# Patient Record
Sex: Male | Born: 1965 | Race: Black or African American | Hispanic: No | Marital: Single | Smoking: Never smoker
Health system: Southern US, Community
[De-identification: ages and names within clinical notes are randomized; demographics above are authoritative.]

## PROBLEM LIST (undated history)

## (undated) DIAGNOSIS — N200 Calculus of kidney: Secondary | ICD-10-CM

## (undated) DIAGNOSIS — I10 Essential (primary) hypertension: Secondary | ICD-10-CM

## (undated) HISTORY — PX: HERNIA REPAIR: SHX51

---

## 1998-05-19 ENCOUNTER — Emergency Department (HOSPITAL_COMMUNITY): Admission: EM | Admit: 1998-05-19 | Discharge: 1998-05-19 | Payer: Self-pay | Admitting: Emergency Medicine

## 1998-05-20 ENCOUNTER — Emergency Department (HOSPITAL_COMMUNITY): Admission: EM | Admit: 1998-05-20 | Discharge: 1998-05-20 | Payer: Self-pay | Admitting: Internal Medicine

## 1998-07-29 ENCOUNTER — Emergency Department (HOSPITAL_COMMUNITY): Admission: EM | Admit: 1998-07-29 | Discharge: 1998-07-29 | Payer: Self-pay | Admitting: Emergency Medicine

## 1998-07-29 ENCOUNTER — Encounter: Payer: Self-pay | Admitting: Emergency Medicine

## 1998-10-12 ENCOUNTER — Emergency Department (HOSPITAL_COMMUNITY): Admission: EM | Admit: 1998-10-12 | Discharge: 1998-10-12 | Payer: Self-pay | Admitting: Internal Medicine

## 1998-11-20 ENCOUNTER — Emergency Department (HOSPITAL_COMMUNITY): Admission: EM | Admit: 1998-11-20 | Discharge: 1998-11-20 | Payer: Self-pay | Admitting: Emergency Medicine

## 1999-09-22 ENCOUNTER — Emergency Department (HOSPITAL_COMMUNITY): Admission: EM | Admit: 1999-09-22 | Discharge: 1999-09-22 | Payer: Self-pay | Admitting: Emergency Medicine

## 2000-11-05 ENCOUNTER — Encounter: Payer: Self-pay | Admitting: Emergency Medicine

## 2000-11-05 ENCOUNTER — Emergency Department (HOSPITAL_COMMUNITY): Admission: EM | Admit: 2000-11-05 | Discharge: 2000-11-05 | Payer: Self-pay | Admitting: Emergency Medicine

## 2001-05-30 ENCOUNTER — Ambulatory Visit (HOSPITAL_COMMUNITY): Admission: RE | Admit: 2001-05-30 | Discharge: 2001-05-30 | Payer: Self-pay | Admitting: General Surgery

## 2001-09-26 ENCOUNTER — Emergency Department (HOSPITAL_COMMUNITY): Admission: EM | Admit: 2001-09-26 | Discharge: 2001-09-26 | Payer: Self-pay | Admitting: Emergency Medicine

## 2001-12-10 ENCOUNTER — Emergency Department (HOSPITAL_COMMUNITY): Admission: EM | Admit: 2001-12-10 | Discharge: 2001-12-10 | Payer: Self-pay | Admitting: Emergency Medicine

## 2001-12-11 ENCOUNTER — Emergency Department (HOSPITAL_COMMUNITY): Admission: EM | Admit: 2001-12-11 | Discharge: 2001-12-11 | Payer: Self-pay

## 2002-04-13 ENCOUNTER — Encounter: Payer: Self-pay | Admitting: Urology

## 2002-04-13 ENCOUNTER — Ambulatory Visit (HOSPITAL_BASED_OUTPATIENT_CLINIC_OR_DEPARTMENT_OTHER): Admission: RE | Admit: 2002-04-13 | Discharge: 2002-04-13 | Payer: Self-pay | Admitting: Urology

## 2003-04-10 ENCOUNTER — Encounter: Payer: Self-pay | Admitting: Emergency Medicine

## 2003-04-10 ENCOUNTER — Emergency Department (HOSPITAL_COMMUNITY): Admission: EM | Admit: 2003-04-10 | Discharge: 2003-04-10 | Payer: Self-pay

## 2003-09-11 ENCOUNTER — Emergency Department (HOSPITAL_COMMUNITY): Admission: EM | Admit: 2003-09-11 | Discharge: 2003-09-11 | Payer: Self-pay | Admitting: Emergency Medicine

## 2005-02-19 ENCOUNTER — Emergency Department (HOSPITAL_COMMUNITY): Admission: EM | Admit: 2005-02-19 | Discharge: 2005-02-19 | Payer: Self-pay | Admitting: Emergency Medicine

## 2005-05-02 ENCOUNTER — Emergency Department (HOSPITAL_COMMUNITY): Admission: EM | Admit: 2005-05-02 | Discharge: 2005-05-02 | Payer: Self-pay | Admitting: Emergency Medicine

## 2006-06-22 ENCOUNTER — Emergency Department (HOSPITAL_COMMUNITY): Admission: EM | Admit: 2006-06-22 | Discharge: 2006-06-22 | Payer: Self-pay | Admitting: Family Medicine

## 2007-05-27 ENCOUNTER — Emergency Department (HOSPITAL_COMMUNITY): Admission: EM | Admit: 2007-05-27 | Discharge: 2007-05-27 | Payer: Self-pay | Admitting: Emergency Medicine

## 2007-05-28 ENCOUNTER — Emergency Department (HOSPITAL_COMMUNITY): Admission: EM | Admit: 2007-05-28 | Discharge: 2007-05-28 | Payer: Self-pay | Admitting: Podiatry

## 2007-09-27 ENCOUNTER — Ambulatory Visit: Payer: Self-pay | Admitting: Family Medicine

## 2007-09-27 DIAGNOSIS — I10 Essential (primary) hypertension: Secondary | ICD-10-CM | POA: Insufficient documentation

## 2007-09-28 LAB — CONVERTED CEMR LAB
BUN: 10 mg/dL (ref 6–23)
Cholesterol: 248 mg/dL (ref 0–200)
Creatinine, Ser: 1 mg/dL (ref 0.4–1.5)
Direct LDL: 173.4 mg/dL
GFR calc Af Amer: 107 mL/min
GFR calc non Af Amer: 88 mL/min
HDL: 41.1 mg/dL (ref >39.0)
Potassium: 4.2 meq/L (ref 3.5–5.1)
Sodium: 139 meq/L (ref 135–145)
Total CHOL/HDL Ratio: 6
Triglycerides: 172 mg/dL — ABNORMAL HIGH (ref 0–149)
VLDL: 34 mg/dL (ref 0–40)

## 2007-09-29 ENCOUNTER — Encounter (INDEPENDENT_AMBULATORY_CARE_PROVIDER_SITE_OTHER): Payer: Self-pay | Admitting: *Deleted

## 2007-12-22 ENCOUNTER — Encounter (INDEPENDENT_AMBULATORY_CARE_PROVIDER_SITE_OTHER): Payer: Self-pay | Admitting: *Deleted

## 2009-02-07 ENCOUNTER — Ambulatory Visit: Payer: Self-pay | Admitting: Internal Medicine

## 2009-02-11 LAB — CONVERTED CEMR LAB
ALT: 22 units/L (ref 0–53)
BUN: 15 mg/dL (ref 6–23)
Basophils Absolute: 0 10*3/uL (ref 0.0–0.1)
Cholesterol: 200 mg/dL (ref 0–200)
Creatinine, Ser: 1 mg/dL (ref 0.4–1.5)
GFR calc non Af Amer: 106.19 mL/min (ref >60)
Glucose, Bld: 84 mg/dL (ref 70–99)
HCT: 39.1 % (ref 39.0–52.0)
HDL: 32.9 mg/dL — ABNORMAL LOW (ref >39.00)
Lymphs Abs: 1.7 10*3/uL (ref 0.7–4.0)
Monocytes Absolute: 0.6 10*3/uL (ref 0.1–1.0)
Monocytes Relative: 10.1 % (ref 3.0–12.0)
Neutrophils Relative %: 59 % (ref 43.0–77.0)
Platelets: 254 10*3/uL (ref 150.0–400.0)
Potassium: 4.1 meq/L (ref 3.5–5.1)
RDW: 13.3 % (ref 11.5–14.6)
TSH: 1.06 microintl units/mL (ref 0.35–5.50)

## 2010-11-03 NOTE — Letter (Signed)
Summary: Staff Medical Report/[facility]  Staff Medical Report/[facility]   Imported By: Lanelle Bal 02/12/2009 11:50:20  _____________________________________________________________________  External Attachment:    Type:   Image     Comment:   External Document

## 2011-02-19 NOTE — Op Note (Signed)
[facility]  Patient:    Miguel Vaughn, Miguel Vaughn Visit Number: [id number] MRN: [id number]          Service Type: NES Location: NESC Attending Physician:  Trisha Mangle Dictated by:   Veverly Fells Vernie Ammons, M.D. Proc. Date: 04/13/02 Admit Date:  04/13/2002 Discharge Date: 04/13/2002                             Operative Report  PREOPERATIVE DIAGNOSIS:  Left ureteral calculus.  POSTOPERATIVE DIAGNOSIS:  Left ureteral calculus.  PROCEDURE:  Cystoscopy, left retrograde pyelogram with interpretation, left ureteroscopy and stone basketing with double J stent placement.  SURGEON:  Mark C. Vernie Ammons, M.D.  ANESTHESIA:  General lineae.  SPECIMEN:  Stone given to patient.  ESTIMATED BLOOD LOSS:  Approximately 1 cc.  DRAINS:  A 26 cm 4.5 [place] double J stent in the left ureter with string.  COMPLICATIONS:  None.  INDICATIONS FOR PROCEDURE:  The patient is a 45 year old black male whose had a left distal ureteral calculus that has failed to progress and it has caused intermittent pain. He is brought to the OR for ureteroscopic extraction and understands the risks, complications, alternatives and limitations to surgery.  DESCRIPTION OF PROCEDURE:  After informed consent, the patient was brought to the major OR, placed on the table, administered general anesthesia and then moved to the dorsal lithotomy position. His genitalia was sterilely prepped and draped. A 6 French rigid ureteroscope was then passed per urethra which was noted to be normal down to the sphincter which was intact. The prostatic urethra was unremarkable. The bladder had no tumor, stones or inflammatory lesions seen on minimal inspection with the ureteroscope. The left orifice was identified and entered with the ureteroscope, I was able to visualize the stone. I then performed retrograde pyelogram in standard fashion. I noted a proximal dilatation of the ureter but no other filling  defects. The stone was seen as a filling defect and the remainder of the collecting system was noted to be normal.  A nitinol basket was then passed through the ureteroscope and the stone was grasped and extracted.  I then inserted the cystoscope and passed the guidewire up the ureter under direct fluoroscopic control into the area of the renal pelvis. The double J stent was passed over the guidewire, the guidewire was removed with good curl being noted in the renal pelvis and bladder. The remainder of the bladder was then inspected with the cystoscope and noted to be normal. I drained the bladder and the patient was awakened and taken to the recovery room in stable satisfactory condition. He tolerated the procedure well with no intraoperative complications.  He will be given a prescription for Pyridium plus one q. 6h p.r.n. for bladder spasms #20 and Vicodin ES #28 p.r.n. for pain. He will follow-up in my office next week for his stent removal. Dictated by:   Veverly Fells. Vernie Ammons, M.D. Attending Physician:  Trisha Mangle DD:  04/13/02 TD:  04/16/02 Job: [id number]

## 2011-02-19 NOTE — Op Note (Signed)
[facility]  Patient:    Miguel Vaughn, Miguel Vaughn Visit Number: [id number] MRN: [id number]          Service Type: DSU Location: DAY Attending Physician:  Glenna Fellows Tappan Proc. Date: 05/30/01 Adm. Date:  05/30/2001                             Operative Report  PREOPERATIVE DIAGNOSIS:  Phimosis with redundant foreskin.  POSTOPERATIVE DIAGNOSIS:  Phimosis with redundant foreskin.  OPERATION PERFORMED:  Circumcision.  SURGEON:  Claudette Laws, M.D.  DESCRIPTION OF PROCEDURE:  This is a second operation for [name]. He had a right inguinal hernia repair by Dr. Glenna Fellows. Following the hernia repair, we prepped and draped the penis and circular incisions were outlined with the marking pen on the skin and mucosal surfaces. A knife was used to outline the incisions and then the excess foreskin was removed with Metzenbaum scissors leaving behind approximately 4-5 mm of mucosa attached to the corona. All bleeders were electrocoagulated. He was then injected with approximately 10 cc of 0.25% Marcaine mixed with 2% xylocaine for a penile block. We then reattached the shaft skin to the mucosa with interrupted sutures of 3-0 chromic catgut. A dry dressing was applied, circular gauze with Betadine and he was then taken back to the recovery room in satisfactory condition. Attending Physician:  Delsa Bern DD:  05/30/01 TD:  05/30/01 Job: [id number] [id number] [id number]

## 2011-02-19 NOTE — Op Note (Signed)
[facility]  Patient:    TESEAN, STUMP Visit Number: [id number] MRN: [id number]          Service Type: DSU Location: DAY Attending Physician:  Glenna Fellows Tappan Proc. Date: 05/30/01 Adm. Date:  05/30/2001                             Operative Report  PREOPERATIVE DIAGNOSIS:  Right inguinal hernia.  POSTOPERATIVE DIAGNOSIS:  Right inguinal hernia.  PROCEDURE:  Right inguinal hernia repair with mesh.  SURGEON:  Lorne Skeens. Hoxworth, M.D.  ASSISTANT:  Claudette Laws, M.D.  ANESTHESIA:  General.  BRIEF HISTORY:  Tayte Mcwherter is a 45 year old black male who presents with the acute onset of a painful and tender small reducible right inguinal hernia. Repair under local anesthesia with sedation using mesh has been recommended and accepted. The nature of the procedure, its indications, and risks of bleeding, infection and recurrence were discussed and understood preoperatively. He is now brought to the operating room for this procedure.  DESCRIPTION OF PROCEDURE:  The patient was brought to the operating room and placed in supine position on the operating table and general endotracheal anesthesia was induced. He was given Ancef intravenously. The right groin and genitalia were sterilely prepped and draped. An oblique incision was made in the right groin and dissection carried down to the subcutaneous tissue. Scarpas fascia was divided. The external oblique was identified and divided along the lines of its ligaments through the external ring. The cord was dissected off the floor of the pubic tubercle and cremasteric fibers were divided bilaterally up to the internal ring. The ilioinguinal nerve was identified and protected throughout the remainder of the dissection. The floor felt intact.  There was a moderate sized cord lipoma coming through a dilated internal ring. This was dissected away from the cord structures back to  and above the level of the internal ring where it was clamped, divided and tied with 3-0 Vicryl. There was no peritoneal sac and likely the preperitoneal herniation through the internal ring was the source of this hernia. Following this, a piece of Prolene mesh was trimmed to size to fit the floor with tails around the cord at the internal ring. The mesh was sutured to the pubic tubercle and then to the inguinal ligament with running 2-0 Prolene. Medially the mesh was sutured to the edge of the rectus sheath with interrupted 2-0 Prolene. The tails were then tacked together lateral to the cord with interrupted 2-0 Prolene creating a new internal ring snug to the finger tip. The operative site was inspected for hemostasis. The soft tissue was infiltrated with Marcaine. The external oblique was closed over the cords with running 3-0 Vicryl. Scarpas fascia was closed with running 3-0 Vicryl and the skin closed with running subcuticular 4-0 monocryl and Dermabond. At this point, Dr. Etta Grandchild proceeded with circumcision as planned. Attending Physician: Elta Guadeloupe DD:  05/30/01 TD:  05/30/01 Job: [id number]

## 2011-07-16 LAB — URINALYSIS, ROUTINE W REFLEX MICROSCOPIC
Glucose, UA: NEGATIVE
pH: 6.5

## 2011-07-16 LAB — DIFFERENTIAL
Basophils Absolute: 0
Basophils Relative: 0
Basophils Relative: 0
Eosinophils Absolute: 0.1
Eosinophils Relative: 2
Monocytes Absolute: 0.4
Monocytes Absolute: 0.6
Monocytes Relative: 10
Neutro Abs: 4
Neutrophils Relative %: 75
Neutrophils Relative %: 80 — ABNORMAL HIGH

## 2011-07-16 LAB — CBC
HCT: 37.7 — ABNORMAL LOW
Hemoglobin: 12.2 — ABNORMAL LOW
Hemoglobin: 12.7 — ABNORMAL LOW
RBC: 4.68
RDW: 12.9
WBC: 5

## 2011-07-16 LAB — COMPREHENSIVE METABOLIC PANEL
ALT: 87 — ABNORMAL HIGH
AST: 84 — ABNORMAL HIGH
Albumin: 3.5
Albumin: 3.7
Alkaline Phosphatase: 63
Alkaline Phosphatase: 64
BUN: 12
CO2: 25
Chloride: 97
Glucose, Bld: 102 — ABNORMAL HIGH
Glucose, Bld: 115 — ABNORMAL HIGH
Potassium: 4.1
Potassium: 4.1
Sodium: 133 — ABNORMAL LOW
Total Bilirubin: 0.6
Total Protein: 7.2

## 2011-11-29 ENCOUNTER — Emergency Department (HOSPITAL_COMMUNITY)
Admission: EM | Admit: 2011-11-29 | Discharge: 2011-11-29 | Disposition: A | Discharge status: Home / Self Care | Payer: 59 | Attending: Emergency Medicine | Admitting: Emergency Medicine

## 2011-11-29 ENCOUNTER — Encounter (HOSPITAL_COMMUNITY): Payer: Self-pay | Admitting: *Deleted

## 2011-11-29 ENCOUNTER — Emergency Department (HOSPITAL_COMMUNITY): Payer: 59

## 2011-11-29 ENCOUNTER — Other Ambulatory Visit: Payer: Self-pay

## 2011-11-29 DIAGNOSIS — I10 Essential (primary) hypertension: Secondary | ICD-10-CM | POA: Insufficient documentation

## 2011-11-29 DIAGNOSIS — R10819 Abdominal tenderness, unspecified site: Secondary | ICD-10-CM | POA: Insufficient documentation

## 2011-11-29 DIAGNOSIS — R1013 Epigastric pain: Secondary | ICD-10-CM

## 2011-11-29 HISTORY — DX: Essential (primary) hypertension: I10

## 2011-11-29 LAB — COMPREHENSIVE METABOLIC PANEL WITH GFR
Albumin: 3.9 g/dL (ref 3.5–5.2)
Alkaline Phosphatase: 72 U/L (ref 39–117)
BUN: 11 mg/dL (ref 6–23)
CO2: 29 meq/L (ref 19–32)
Chloride: 100 meq/L (ref 96–112)
GFR calc non Af Amer: 85 mL/min — ABNORMAL LOW (ref >90)
Potassium: 4.1 meq/L (ref 3.5–5.1)
Total Bilirubin: 0.3 mg/dL (ref 0.3–1.2)

## 2011-11-29 LAB — COMPREHENSIVE METABOLIC PANEL
ALT: 33 U/L (ref 0–53)
AST: 26 U/L (ref 0–37)
Calcium: 9.7 mg/dL (ref 8.4–10.5)
Creatinine, Ser: 1.05 mg/dL (ref 0.50–1.35)
GFR calc Af Amer: >90 mL/min (ref >90)
Glucose, Bld: 84 mg/dL (ref 70–99)
Sodium: 137 mEq/L (ref 135–145)
Total Protein: 7.5 g/dL (ref 6.0–8.3)

## 2011-11-29 LAB — DIFFERENTIAL
Basophils Absolute: 0 K/uL (ref 0.0–0.1)
Basophils Relative: 0 % (ref 0–1)
Eosinophils Absolute: 0.3 10*3/uL (ref 0.0–0.7)
Eosinophils Relative: 4 % (ref 0–5)
Lymphocytes Relative: 34 % (ref 12–46)
Lymphs Abs: 2.7 10*3/uL (ref 0.7–4.0)
Monocytes Absolute: 0.6 10*3/uL (ref 0.1–1.0)
Monocytes Relative: 7 % (ref 3–12)
Neutro Abs: 4.3 K/uL (ref 1.7–7.7)
Neutrophils Relative %: 55 % (ref 43–77)

## 2011-11-29 LAB — CBC
HCT: 40.6 % (ref 39.0–52.0)
Hemoglobin: 13.4 g/dL (ref 13.0–17.0)
MCH: 27.1 pg (ref 26.0–34.0)
MCHC: 33 g/dL (ref 30.0–36.0)
MCV: 82.2 fL (ref 78.0–100.0)
Platelets: 263 10*3/uL (ref 150–400)
RBC: 4.94 MIL/uL (ref 4.22–5.81)
RDW: 13.7 % (ref 11.5–15.5)
WBC: 7.9 K/uL (ref 4.0–10.5)

## 2011-11-29 LAB — LIPASE, BLOOD: Lipase: 22 U/L (ref 11–59)

## 2011-11-29 MED ORDER — PANTOPRAZOLE SODIUM 20 MG PO TBEC: 40.0000 mg | DELAYED_RELEASE_TABLET | Freq: Every day | ORAL | Status: DC

## 2011-11-29 MED ORDER — GI COCKTAIL ~~LOC~~
30.0000 mL | Freq: Once | ORAL | Status: AC
  Administered 2011-11-29: 30 mL via ORAL
  Filled 2011-11-29: qty 30

## 2011-11-29 MED ORDER — ONDANSETRON HCL 4 MG PO TABS: 4.0000 mg | ORAL_TABLET | Freq: Three times a day (TID) | ORAL | Status: AC | PRN

## 2011-11-29 MED ORDER — PANTOPRAZOLE SODIUM 40 MG PO TBEC: 40.0000 mg | DELAYED_RELEASE_TABLET | Freq: Every day | ORAL | Status: DC

## 2011-11-29 NOTE — Discharge Instructions (Signed)

## 2011-11-29 NOTE — ED Notes (Signed)
Pt reports upper abd pain that started x 2 days ago.  Pt denies any n/v/d at this time.  Pt also reports bila elbow pain-denies injury at this time.

## 2011-11-29 NOTE — ED Provider Notes (Signed)
History     CSN: [id number]  Arrival date & time 11/29/11  1737   First MD Initiated Contact with Patient 11/29/11 1817      Chief Complaint  Patient presents with  . Abdominal Pain    (Consider location/radiation/quality/duration/timing/severity/associated sxs/prior treatment) HPI Comments: Patient reports gradual onset and persistence of some upper abdominal discomfort which is slightly worse in the left upper side the right. He denies a skin rash. He denies any nausea or vomiting. He reports today it seemed worse about one hour after eating a sausage biscuit. He reports it has affected his appetite which is down. He denies any vomiting or diarrhea and no obvious sick contacts. He denies any new medications or foreign travel. He denies fever or chills or night sweats. He denies any unforeseen weight loss or weight gain. He denies radiation into his chest or into his lower abdomen or flanks. He denies dysuria. He reports no prior history of any abdominal surgeries. He denies any trauma. He reports that when he stretches his torso or coughs, the pain really seems to get worse. She also notes that his elbows have been more sore in the last several days. 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 He denies any skin rash, swelling, overt trauma. He reports no chest pain or shortness of breath or exertional symptoms. He reports certain movements of his elbows and upper extremity seem to exacerbate the discomfort. He has not taken any meds for either pain other than one dose of Pepto-Bismol about 3 days ago which did not change his pain. Since the pain seemed to be a little bit worse last night and today, the patient came here to the emergency department to be evaluated.  The history is provided by the patient.    Past Medical History  Diagnosis Date  . Hypertension     Past Surgical History  Procedure Date  . Hernia repair     History reviewed. No  pertinent family history.  History  Substance Use Topics  . Smoking status: Never Smoker   . Smokeless tobacco: Not on file  . Alcohol Use: No      Review of Systems  Constitutional: Positive for appetite change. Negative for fever, chills and activity change.  HENT: Negative for congestion and rhinorrhea.   Respiratory: Negative for cough and shortness of breath.   Cardiovascular: Negative for chest pain and leg swelling.  Gastrointestinal: Positive for abdominal pain.  Genitourinary: Negative for dysuria and flank pain.  Musculoskeletal: Negative for back pain.  All other systems reviewed and are negative.    Allergies  Shellfish allergy  Home Medications   Current Outpatient Rx  Name Route Sig Dispense Refill  . BISMUTH SUBSALICYLATE 262 MG/15ML PO SUSP Oral Take 15 mLs by mouth every 6 (six) hours as needed. For upset stomach    . PSEUDOEPH-DOXYLAMINE-DM-APAP 60-7.03-02-999 MG/30ML PO LIQD Oral Take 30 mLs by mouth every 6 (six) hours as needed. For col/flu symptom relief    . ONDANSETRON HCL 4 MG PO TABS Oral Take 1 tablet (4 mg total) by mouth every 8 (eight) hours as needed for nausea. 12 tablet 0  . PANTOPRAZOLE SODIUM 40 MG PO TBEC Oral Take 1 tablet (40 mg total) by mouth daily. 14 tablet 0    BP 121/98  Pulse 72  Temp(Src) 98.2 F (36.8 C) (Oral)  Resp 18  Wt 205 lb (92.987 kg)  SpO2 98%  Physical Exam  Nursing  note and vitals reviewed. Constitutional: He is oriented to person, place, and time. He appears well-developed and well-nourished.  HENT:  Head: Normocephalic.  Eyes: Pupils are equal, round, and reactive to light.  Cardiovascular: Normal rate.   Pulmonary/Chest: Effort normal and breath sounds normal. No respiratory distress. He has no wheezes.  Abdominal: Soft. Bowel sounds are normal. There is tenderness. There is no rebound and no guarding.  Musculoskeletal: He exhibits no edema and no tenderness.  Neurological: He is alert and oriented to  person, place, and time.  Skin: Skin is warm and dry. No rash noted. No erythema.  Psychiatric: He has a normal mood and affect.    ED Course  Procedures (including critical care time)  Labs Reviewed  COMPREHENSIVE METABOLIC PANEL - Abnormal; Notable for the following:    GFR calc non Af Amer 85 (*)    All other components within normal limits  CBC  DIFFERENTIAL  LIPASE, BLOOD   US Abdomen Complete  11/29/2011  *RADIOLOGY REPORT*  Clinical Data:  Abdominal pain  COMPLETE ABDOMINAL ULTRASOUND  Comparison:  None.  Findings:  Gallbladder:  No shadowing gallstones or echogenic sludge.  No gallbladder wall thickening or pericholecystic fluid.  Negative sonographic Murphy's sign according to the ultrasound technologist.  Common bile duct:  3.5 mm diameter, unremarkable  Liver:  No focal lesion identified.  Within normal limits in parenchymal echogenicity.  IVC:  Appears normal.  Pancreas:  Unremarkable, segments obscured by overlying bowel gas.  Spleen:  5.6 cm craniocaudal length, unremarkable  Right Kidney:  10 cm. No hydronephrosis.  Well-preserved cortex. Normal size and parenchymal echotexture without focal abnormalities.  Left Kidney:  10.2 cm. No hydronephrosis.  Well-preserved cortex. Normal size and parenchymal echotexture without focal abnormalities.  Abdominal aorta:  No aneurysm identified.  IMPRESSION:  1.  Negative.  Normal gallbladder.  Original Report Authenticated By: Thora Lance III, M.D.     1. Epigastric pain     ECG at time 18:15 shows NSR at rate 58, non specific T Wave abn's flipped in inf and lateral leads.  Lateral T Wave abn is worse compared to ECG from 05/27/07 which can be explained by repol abn related to HTN.    Given pt's abd tenderness on exam, I do not feel these changes reflect any acute cardiac ischemic changes.  No SOB, exertional worsening of symptoms, no diaphoresis or nausea is associated with his symptoms.    MDM  Will get U/S to r/o biliary  disease.  Will try GI cocktail otherwise to see if symptoms are relieved.  Stable vitals, no fever . No lower abd pain or tenderness, doesn't sound to be cardiac symptoms and cardiac exam is otherwise normal.        No sig rebound, I doubt surgical abd.  U/S which I reviewed myself, per radiologist shows normal GB, no acute abn's.  Labs are unremarkable.  I favor either musculoskeletal or GERD and indigestion, possibly early ulcer.  Pt counseled about musculoskeletal pain in elbows as well as follow up with PCP regarding elevated BP's.    9:11 PM Pt feels improved.  abd remains soft, no rebound, no guarding.  He feels comfortable going home, takign PPI, will give antiemetic just in case, knows to return for sudden worsening pain, fevers, persistent vomiting, blood in stool, CP, SOB.    Gavin Pound. Oletta Lamas, MD 11/29/11 2117

## 2011-12-20 ENCOUNTER — Encounter: Payer: 59 | Admitting: Internal Medicine

## 2012-01-13 ENCOUNTER — Emergency Department (HOSPITAL_COMMUNITY): Payer: 59

## 2012-01-13 ENCOUNTER — Emergency Department (HOSPITAL_COMMUNITY)
Admission: EM | Admit: 2012-01-13 | Discharge: 2012-01-13 | Disposition: A | Discharge status: Home / Self Care | Payer: 59 | Attending: Emergency Medicine | Admitting: Emergency Medicine

## 2012-01-13 ENCOUNTER — Encounter (HOSPITAL_COMMUNITY): Payer: Self-pay | Admitting: Radiology

## 2012-01-13 DIAGNOSIS — G44309 Post-traumatic headache, unspecified, not intractable: Secondary | ICD-10-CM | POA: Insufficient documentation

## 2012-01-13 DIAGNOSIS — I10 Essential (primary) hypertension: Secondary | ICD-10-CM | POA: Insufficient documentation

## 2012-01-13 MED ORDER — IBUPROFEN 600 MG PO TABS: 600.0000 mg | ORAL_TABLET | Freq: Four times a day (QID) | ORAL | Status: AC | PRN

## 2012-01-13 MED ORDER — TRAMADOL HCL 50 MG PO TABS: 50.0000 mg | ORAL_TABLET | Freq: Four times a day (QID) | ORAL | Status: AC | PRN

## 2012-01-13 MED ORDER — TRAMADOL HCL 50 MG PO TABS
50.0000 mg | ORAL_TABLET | Freq: Once | ORAL | Status: AC
  Administered 2012-01-13: 50 mg via ORAL
  Filled 2012-01-13: qty 1

## 2012-01-13 NOTE — Discharge Instructions (Signed)
Post-Concussion Syndrome Post-concussion syndrome describes the symptoms that can occur after a head injury. These symptoms can last from weeks to months. CAUSES  It is not clear why some head injuries cause post-concussion syndrome. It can occur whether your head injury was mild or severe and whether you were wearing head protection or not.  SYMPTOMS  Memory difficulties.   Dizziness.   Headaches.   Double vision or blurry vision.   Sensitivity to light.   Hearing difficulties.   Depression.   Tiredness.   Weakness.   Difficulty with concentration.   Difficulty sleeping or staying asleep.   Vomiting.  DIAGNOSIS  There is no test to determine whether you have post-concussion syndrome. Your caregiver may order an imaging scan of your brain, such as a CT scan, to check for other problems that may be causing your symptoms (such as severe injury inside your skull). TREATMENT  Usually, these problems disappear over time without medical care. Your caregiver may prescribe medicine to help ease your symptoms. It is important to follow up with a neurologist to evaluate your recovery and address any lingering symptoms or issues. HOME CARE INSTRUCTIONS   Only take over-the-counter or prescription medicines for pain, discomfort, or fever as directed by your caregiver. Do not take aspirin. Aspirin can slow blood clotting.   Sleep with your head slightly elevated to help with headaches.   Avoid any situation where there is potential for another head injury (football, hockey, martial arts, horseback riding). Your condition will get worse every time you experience a concussion. You should avoid these activities until you are evaluated by the appropriate follow-up caregivers.   Keep all follow-up appointments as directed by your caregiver.  SEEK IMMEDIATE MEDICAL CARE IF:  You develop confusion or unusual drowsiness.   You cannot wake the injured person.   You develop nausea or  persistent, forceful vomiting.   You feel like you are moving when you are not (vertigo).   You notice the injured person's eyes moving rapidly back and forth. This may be a sign of vertigo.   You have convulsions or faint.   You have severe, persistent headaches that are not relieved by medicine.   You cannot use your arms or legs normally.   Your pupils change size.   You have clear or bloody discharge from the nose or ears.   Your problems are getting worse, not better.  MAKE SURE YOU:  Understand these instructions.   Will watch your condition.   Will get help right away if you are not doing well or get worse.  Document Released: 03/12/2002 Document Revised: 09/09/2011 Document Reviewed: 04/08/2011 [facility] Patient Information 2012 [place], [place].
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
## 2012-01-13 NOTE — ED Notes (Signed)
Reviewed discharge instructions and Rx for Motrin and Ultram
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
## 2012-01-18 ENCOUNTER — Encounter: Payer: Self-pay | Admitting: Internal Medicine

## 2012-01-18 ENCOUNTER — Ambulatory Visit (INDEPENDENT_AMBULATORY_CARE_PROVIDER_SITE_OTHER): Payer: 59 | Admitting: Internal Medicine

## 2012-01-18 VITALS — BP 134/86 | HR 77 | Temp 98.2°F | Wt 210.0 lb

## 2012-01-18 DIAGNOSIS — S060X9A Concussion with loss of consciousness of unspecified duration, initial encounter: Secondary | ICD-10-CM
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
## 2012-01-28 ENCOUNTER — Encounter: Payer: 59 | Admitting: Internal Medicine

## 2012-02-25 ENCOUNTER — Encounter (HOSPITAL_COMMUNITY): Payer: Self-pay | Admitting: *Deleted

## 2012-02-25 ENCOUNTER — Emergency Department (HOSPITAL_COMMUNITY): Payer: Worker's Compensation

## 2012-02-25 ENCOUNTER — Emergency Department (HOSPITAL_COMMUNITY)
Admission: EM | Admit: 2012-02-25 | Discharge: 2012-02-25 | Disposition: A | Discharge status: Home / Self Care | Payer: Worker's Compensation | Attending: Emergency Medicine | Admitting: Emergency Medicine

## 2012-02-25 DIAGNOSIS — Y998 Other external cause status: Secondary | ICD-10-CM | POA: Insufficient documentation

## 2012-02-25 DIAGNOSIS — S02640A Fracture of ramus of mandible, unspecified side, initial encounter for closed fracture: Secondary | ICD-10-CM | POA: Insufficient documentation

## 2012-02-25 DIAGNOSIS — Y9389 Activity, other specified: Secondary | ICD-10-CM | POA: Insufficient documentation

## 2012-02-25 DIAGNOSIS — S02609A Fracture of mandible, unspecified, initial encounter for closed fracture: Secondary | ICD-10-CM

## 2012-02-25 DIAGNOSIS — W1809XA Striking against other object with subsequent fall, initial encounter: Secondary | ICD-10-CM | POA: Insufficient documentation

## 2012-02-25 DIAGNOSIS — I10 Essential (primary) hypertension: Secondary | ICD-10-CM | POA: Insufficient documentation

## 2012-02-25 MED ORDER — HYDROCODONE-ACETAMINOPHEN 7.5-500 MG/15ML PO SOLN: 15.0000 mL | Freq: Four times a day (QID) | ORAL | Status: DC | PRN

## 2012-02-25 MED ORDER — CLINDAMYCIN HCL 300 MG PO CAPS
300.0000 mg | ORAL_CAPSULE | Freq: Once | ORAL | Status: AC
  Administered 2012-02-25: 300 mg via ORAL
  Filled 2012-02-25: qty 1

## 2012-02-25 MED ORDER — OXYCODONE-ACETAMINOPHEN 5-325 MG PO TABS
1.0000 | ORAL_TABLET | Freq: Once | ORAL | Status: AC
  Administered 2012-02-25: 1 via ORAL
  Filled 2012-02-25: qty 1

## 2012-02-25 MED ORDER — CLINDAMYCIN HCL 150 MG PO CAPS: ORAL_CAPSULE | ORAL | Status: DC

## 2012-02-25 NOTE — ED Provider Notes (Signed)
Fall with isolated injury with isolated pain and tenderness to the ankle of the right mandible with jaw malocclusion for the last 2 days with dentition intact and no neck pain or cervical spine tenderness with x-rays revealing mildly displaced fracture of the right mandibular ramus, ENT paged on-call for mandible for f/u. ENT will take Pt to OR next day.  Medical screening examination/treatment/procedure(s) were conducted as a shared visit with non-physician practitioner(s) and myself.  I personally evaluated the patient during the encounter  Hurman Horn, MD 02/28/12 2307

## 2012-02-25 NOTE — ED Provider Notes (Signed)
History     CSN: [id number]  Arrival date & time 02/25/12  1756   First MD Initiated Contact with Patient 02/25/12 1838      Chief Complaint  Patient presents with  . Facial Injury    (Consider location/radiation/quality/duration/timing/severity/associated sxs/prior treatment) HPI  46 year old male presents complaining of jaw pain. Patient states several days ago he was getting into his truck when he slipped, hitting his right side of face against concrete. He noticed immediate pain to his jaw. He is also having trouble chewing due to jaw pain.  Patient noticed that his teeth are not aligning appropriately.  Sts he can't open mouth and haven't been able to eat since.  He also experiencing headache due to his jaw pain.  Pain radiates to R ear.  Denies LOC, neck pain, bleeding in mouth, loose tooth, numbness or rash.  Denies throat swelling.    Past Medical History  Diagnosis Date  . Hypertension     Past Surgical History  Procedure Date  . Hernia repair     No family history on file.  History  Substance Use Topics  . Smoking status: Never Smoker   . Smokeless tobacco: Not on file  . Alcohol Use: No      Review of Systems  All other systems reviewed and are negative.    Allergies  Shellfish allergy  Home Medications   Current Outpatient Rx  Name Route Sig Dispense Refill  . ACETAMINOPHEN 325 MG PO TABS Oral Take 650 mg by mouth every 6 (six) hours as needed. For pain      BP 133/96  Pulse 66  Temp(Src) 98.4 F (36.9 C) (Oral)  Resp 18  Wt 200 lb (90.719 kg)  SpO2 99%  Physical Exam  Nursing note and vitals reviewed. Constitutional: He appears well-developed and well-nourished. No distress.  HENT:  Head: Normocephalic.  Right Ear: External ear normal.  Left Ear: External ear normal.  Nose: Nose normal.  Mouth/Throat: No oropharyngeal exudate.       Edema noted to right mandibular region with point tenderness to the right rami of jaw. Trismus noted.  No loose teeth on exam. No laceration to oral mucosa.  Eyes: Conjunctivae are normal.  Neck: Normal range of motion. Neck supple.  Musculoskeletal: Normal range of motion.       Cervical back: Normal.  Neurological: He is alert.  Skin: Skin is warm. No rash noted.    ED Course  Procedures (including critical care time)  Labs Reviewed - No data to display No results found.   No diagnosis found.  Dg Orthopantogram  02/25/2012  *RADIOLOGY REPORT*  Clinical Data: Fall, right facial injury, right jaw pain  ORTHOPANTOGRAM/PANORAMIC  Comparison: None.  Findings: Mildly displaced fracture through the right mandibular ramus.  IMPRESSION: Mildly displaced fracture through the right mandibular ramus.  Original Report Authenticated By: Charline Bills, M.D.      MDM  Patient has a mechanical fall and injure his right jaw at the rami. X-ray shows a mildly displaced fracture through the right mandibular ramus. This is a closed fracture.  No other injury. I discussed result with patient and with my attending who has seen and evaluate patient. Patient will follow up with the specialist for further management. Care instruction given.  8:33 PM Dr. Pollyann Kennedy has been consulted by my attending.  Pt is scheduled to have surgery ORIF tomorrow.  WIll give clindamycin and pain medication here.  Recommend NPO after midnight.  Pt to come to  [facility] for surgery tomorrow in the AM.  Pt voice understanding and agrees with plan.        Fayrene Helper, PA-C 02/25/12 2103

## 2012-02-25 NOTE — ED Notes (Signed)
Given apple sauce per pt request.

## 2012-02-25 NOTE — ED Notes (Signed)
Pt states "I was getting in my truck & slipped hitting my face, can't open my mouth and haven't eaten since it happened"; pt presents with right facial edema

## 2012-02-25 NOTE — Discharge Instructions (Addendum)
Come to [facility] Short Stay area - be there at 6:00 am for 7:30 surgery.  Take Clindamycin 300mg  once tonight before midnight.  Take pain medication as needed before midnight.  Nothing to eat or drink after midnight tonight.  Plan to stay the night Saturday and go home Sunday morning.  Mandibular Fracture You have a fracture of your mandible. This fracture is a break in the jaw bone. This is usually caused by trauma (a blow to the jaw). HOME CARE INSTRUCTIONS   If your jaws are wired, learn how to release them quickly in case of vomiting or severe coughing. Keep the wire cutters in an easy to reach location or carry them with you.   Apply ice to the injury for 15 to 20 minutes each hour while awake for the first 2 days. Put the ice in a plastic bag and place a towel between the bag of ice and your skin.   Eat a well balanced high-protein (full) liquid diet while your jaw is wired. Your caregiver or dietician can help you with this.   Protect your jaw from pressure. Sleep on your back.   Avoid exercising to the point that you become short of breath.   Only take over-the-counter or prescription medicines for pain, discomfort, or fever as directed by your caregiver.  SEEK MEDICAL CARE IF:   You develop a severe headache or numbness in your face.   You have severe jaw pain unrelieved with medications.   The wires or splints become loose.   You have uncontrollable nausea (feeling sick to your stomach) or anxiety.  SEEK IMMEDIATE MEDICAL CARE IF:  You have a fever.   You have difficulty breathing.   You feel like your airway is closing or hear a high-pitched sound when you try and breathe.   Problems have forced you to cut the wires holding your upper and lower teeth together.  MAKE SURE YOU:   Understand these instructions.   Will watch your condition.   Will get help right away if you are not doing well or get worse.  Document Released: 09/20/2005 Document  Revised: 09/09/2011 Document Reviewed: 05/10/2008 [facility] Patient Information 2012 [place], [place].

## 2012-02-26 ENCOUNTER — Ambulatory Visit (HOSPITAL_COMMUNITY): Payer: Worker's Compensation | Admitting: Anesthesiology

## 2012-02-26 ENCOUNTER — Encounter (HOSPITAL_COMMUNITY): Payer: Self-pay | Admitting: *Deleted

## 2012-02-26 ENCOUNTER — Encounter (HOSPITAL_COMMUNITY)
Disposition: A | Discharge status: Home / Self Care | Payer: Self-pay | Source: Ambulatory Visit | Attending: Otolaryngology

## 2012-02-26 ENCOUNTER — Encounter (HOSPITAL_COMMUNITY): Payer: Self-pay | Admitting: Anesthesiology

## 2012-02-26 ENCOUNTER — Ambulatory Visit (HOSPITAL_COMMUNITY)
Admit: 2012-02-26 | Discharge: 2012-02-27 | Disposition: A | Discharge status: Home / Self Care | Payer: Worker's Compensation | Source: Ambulatory Visit | Attending: Otolaryngology | Admitting: Otolaryngology

## 2012-02-26 DIAGNOSIS — S02640A Fracture of ramus of mandible, unspecified side, initial encounter for closed fracture: Secondary | ICD-10-CM | POA: Insufficient documentation

## 2012-02-26 DIAGNOSIS — W19XXXA Unspecified fall, initial encounter: Secondary | ICD-10-CM | POA: Insufficient documentation

## 2012-02-26 DIAGNOSIS — S02609A Fracture of mandible, unspecified, initial encounter for closed fracture: Secondary | ICD-10-CM

## 2012-02-26 DIAGNOSIS — I10 Essential (primary) hypertension: Secondary | ICD-10-CM | POA: Insufficient documentation

## 2012-02-26 HISTORY — PX: ORIF MANDIBULAR FRACTURE: SHX2127

## 2012-02-26 LAB — SURGICAL PCR SCREEN: MRSA, PCR: NEGATIVE

## 2012-02-26 LAB — CBC
Hemoglobin: 12.9 g/dL — ABNORMAL LOW (ref 13.0–17.0)
MCH: 26.7 pg (ref 26.0–34.0)
RBC: 4.83 MIL/uL (ref 4.22–5.81)
WBC: 7.7 10*3/uL (ref 4.0–10.5)

## 2012-02-26 SURGERY — OPEN REDUCTION INTERNAL FIXATION (ORIF) MANDIBULAR FRACTURE
Anesthesia: General | Site: Mouth | Wound class: Clean Contaminated

## 2012-02-26 MED ORDER — PROPOFOL 10 MG/ML IV EMUL
INTRAVENOUS | Status: DC | PRN
  Administered 2012-02-26: 200 mg via INTRAVENOUS

## 2012-02-26 MED ORDER — MIDAZOLAM HCL 5 MG/5ML IJ SOLN
INTRAMUSCULAR | Status: DC | PRN
  Administered 2012-02-26: 2 mg via INTRAVENOUS

## 2012-02-26 MED ORDER — SUFENTANIL CITRATE 50 MCG/ML IV SOLN
INTRAVENOUS | Status: DC | PRN
  Administered 2012-02-26: 20 ug via INTRAVENOUS
  Administered 2012-02-26: 10 ug via INTRAVENOUS

## 2012-02-26 MED ORDER — HYDROCODONE-ACETAMINOPHEN 7.5-500 MG/15ML PO SOLN
15.0000 mL | Freq: Four times a day (QID) | ORAL | Status: DC | PRN
  Administered 2012-02-26: 15 mL via ORAL
  Filled 2012-02-26: qty 15

## 2012-02-26 MED ORDER — MUPIROCIN 2 % EX OINT
TOPICAL_OINTMENT | CUTANEOUS | Status: AC
  Administered 2012-02-26: 1 via NASAL
  Filled 2012-02-26: qty 22

## 2012-02-26 MED ORDER — HYDROMORPHONE HCL PF 1 MG/ML IJ SOLN
INTRAMUSCULAR | Status: AC
  Filled 2012-02-26: qty 1

## 2012-02-26 MED ORDER — ACETAMINOPHEN 10 MG/ML IV SOLN
1000.0000 mg | Freq: Once | INTRAVENOUS | Status: DC
  Administered 2012-02-26: 1000 mg via INTRAVENOUS

## 2012-02-26 MED ORDER — PROMETHAZINE HCL 25 MG RE SUPP
25.0000 mg | Freq: Four times a day (QID) | RECTAL | Status: DC | PRN
  Filled 2012-02-26: qty 1

## 2012-02-26 MED ORDER — ACETAMINOPHEN 10 MG/ML IV SOLN
1000.0000 mg | Freq: Once | INTRAVENOUS | Status: AC
  Filled 2012-02-26: qty 100

## 2012-02-26 MED ORDER — LABETALOL HCL 5 MG/ML IV SOLN
INTRAVENOUS | Status: DC | PRN
  Administered 2012-02-26: 10 mg via INTRAVENOUS

## 2012-02-26 MED ORDER — ACETAMINOPHEN 10 MG/ML IV SOLN
INTRAVENOUS | Status: AC
  Administered 2012-02-26: 1000 mg
  Filled 2012-02-26: qty 100

## 2012-02-26 MED ORDER — ONDANSETRON HCL 4 MG/2ML IJ SOLN
4.0000 mg | Freq: Three times a day (TID) | INTRAMUSCULAR | Status: DC | PRN
  Administered 2012-02-26: 4 mg via INTRAVENOUS
  Filled 2012-02-26: qty 2

## 2012-02-26 MED ORDER — DEXTROSE-NACL 5-0.9 % IV SOLN
INTRAVENOUS | Status: DC
  Administered 2012-02-26 – 2012-02-27 (×2): via INTRAVENOUS

## 2012-02-26 MED ORDER — CLINDAMYCIN HCL 300 MG PO CAPS: 300.0000 mg | ORAL_CAPSULE | Freq: Three times a day (TID) | ORAL | Status: AC

## 2012-02-26 MED ORDER — TRAMADOL HCL 50 MG PO TABS
50.0000 mg | ORAL_TABLET | Freq: Four times a day (QID) | ORAL | Status: DC
  Administered 2012-02-26 – 2012-02-27 (×3): 50 mg via ORAL
  Filled 2012-02-26 (×7): qty 1

## 2012-02-26 MED ORDER — SUCCINYLCHOLINE CHLORIDE 20 MG/ML IJ SOLN
INTRAMUSCULAR | Status: DC | PRN
  Administered 2012-02-26: 120 mg via INTRAVENOUS

## 2012-02-26 MED ORDER — ONDANSETRON HCL 4 MG/2ML IJ SOLN: 4.0000 mg | Freq: Once | INTRAMUSCULAR | Status: DC | PRN

## 2012-02-26 MED ORDER — LACTATED RINGERS IV SOLN
INTRAVENOUS | Status: DC | PRN
  Administered 2012-02-26: 08:00:00 via INTRAVENOUS

## 2012-02-26 MED ORDER — CLINDAMYCIN HCL 300 MG PO CAPS
300.0000 mg | ORAL_CAPSULE | Freq: Three times a day (TID) | ORAL | Status: DC
  Administered 2012-02-26 – 2012-02-27 (×3): 300 mg via ORAL
  Filled 2012-02-26 (×6): qty 1

## 2012-02-26 MED ORDER — HYDROMORPHONE HCL PF 1 MG/ML IJ SOLN
0.2500 mg | INTRAMUSCULAR | Status: DC | PRN
  Administered 2012-02-26 (×3): 0.5 mg via INTRAVENOUS

## 2012-02-26 MED ORDER — OXYMETAZOLINE HCL 0.05 % NA SOLN
2.0000 | NASAL | Status: DC
  Filled 2012-02-26: qty 15

## 2012-02-26 MED ORDER — DEXTROSE 5 % IV SOLN
INTRAVENOUS | Status: AC
  Filled 2012-02-26: qty 50

## 2012-02-26 MED ORDER — PROMETHAZINE HCL 25 MG PO TABS
25.0000 mg | ORAL_TABLET | Freq: Four times a day (QID) | ORAL | Status: DC | PRN
  Administered 2012-02-26: 25 mg via ORAL
  Filled 2012-02-26: qty 1

## 2012-02-26 MED ORDER — WHITE PETROLATUM GEL
Status: AC
  Administered 2012-02-26: 14:00:00
  Filled 2012-02-26: qty 5

## 2012-02-26 MED ORDER — ROCURONIUM BROMIDE 100 MG/10ML IV SOLN
INTRAVENOUS | Status: DC | PRN
  Administered 2012-02-26: 200 mg via INTRAVENOUS
  Administered 2012-02-26: 20 mg via INTRAVENOUS

## 2012-02-26 MED ORDER — IBUPROFEN 100 MG/5ML PO SUSP
400.0000 mg | Freq: Four times a day (QID) | ORAL | Status: DC | PRN
  Administered 2012-02-26 (×2): 400 mg via ORAL
  Filled 2012-02-26 (×3): qty 20

## 2012-02-26 MED ORDER — ONDANSETRON HCL 4 MG/2ML IJ SOLN
INTRAMUSCULAR | Status: DC | PRN
  Administered 2012-02-26: 4 mg via INTRAVENOUS

## 2012-02-26 MED ORDER — TRAMADOL 5 MG/ML ORAL SUSPENSION: 50.0000 mg | Freq: Four times a day (QID) | ORAL | Status: DC

## 2012-02-26 MED ORDER — CLINDAMYCIN PHOSPHATE 600 MG/50ML IV SOLN
600.0000 mg | INTRAVENOUS | Status: AC
  Administered 2012-02-26: 600 mg via INTRAVENOUS
  Filled 2012-02-26: qty 50

## 2012-02-26 MED ORDER — OXYMETAZOLINE HCL 0.05 % NA SOLN
NASAL | Status: DC | PRN
  Administered 2012-02-26: 1 via NASAL

## 2012-02-26 MED ORDER — NEOSTIGMINE METHYLSULFATE 1 MG/ML IJ SOLN
INTRAMUSCULAR | Status: DC | PRN
  Administered 2012-02-26: 5 mg via INTRAVENOUS

## 2012-02-26 MED ORDER — ACETAMINOPHEN 10 MG/ML IV SOLN: 1000.0000 mg | Freq: Four times a day (QID) | INTRAVENOUS | Status: DC

## 2012-02-26 MED ORDER — PROMETHAZINE HCL 25 MG RE SUPP: 25.0000 mg | Freq: Four times a day (QID) | RECTAL | Status: AC | PRN

## 2012-02-26 MED ORDER — CLINDAMYCIN PHOSPHATE 600 MG/4ML IJ SOLN
INTRAMUSCULAR | Status: AC
  Filled 2012-02-26: qty 4

## 2012-02-26 MED ORDER — 0.9 % SODIUM CHLORIDE (POUR BTL) OPTIME
TOPICAL | Status: DC | PRN
  Administered 2012-02-26: 1000 mL

## 2012-02-26 MED ORDER — GLYCOPYRROLATE 0.2 MG/ML IJ SOLN
INTRAMUSCULAR | Status: DC | PRN
  Administered 2012-02-26: 0.6 mg via INTRAVENOUS

## 2012-02-26 SURGICAL SUPPLY — 36 items
CANISTER SUCTION 2500CC (MISCELLANEOUS) ×2 IMPLANT
CLEANER TIP ELECTROSURG 2X2 (MISCELLANEOUS) IMPLANT
CLOTH BEACON ORANGE TIMEOUT ST (SAFETY) ×2 IMPLANT
COVER SURGICAL LIGHT HANDLE (MISCELLANEOUS) ×2 IMPLANT
DECANTER SPIKE VIAL GLASS SM (MISCELLANEOUS) ×2 IMPLANT
ELECT COATED BLADE 2.86 ST (ELECTRODE) ×2 IMPLANT
ELECT NEEDLE TIP 2.8 STRL (NEEDLE) IMPLANT
ELECT REM PT RETURN 9FT ADLT (ELECTROSURGICAL) ×2
ELECTRODE REM PT RTRN 9FT ADLT (ELECTROSURGICAL) ×1 IMPLANT
ERX 10427 IMPLANT
GLOVE BIO SURGEON STRL SZ8 (GLOVE) ×2 IMPLANT
GLOVE BIOGEL PI IND STRL 8 (GLOVE) ×1 IMPLANT
GLOVE BIOGEL PI INDICATOR 8 (GLOVE) ×1
GLOVE ECLIPSE 7.5 STRL STRAW (GLOVE) ×2 IMPLANT
GOWN STRL NON-REIN LRG LVL3 (GOWN DISPOSABLE) ×2 IMPLANT
GOWN STRL REIN 2XL LVL4 (GOWN DISPOSABLE) ×2 IMPLANT
KIT BASIN OR (CUSTOM PROCEDURE TRAY) ×2 IMPLANT
KIT ROOM TURNOVER OR (KITS) ×2 IMPLANT
NEEDLE 27GAX1X1/2 (NEEDLE) ×2 IMPLANT
NEEDLE HYPO 25GX1X1/2 BEV (NEEDLE) IMPLANT
NS IRRIG 1000ML POUR BTL (IV SOLUTION) ×2 IMPLANT
PAD ARMBOARD 7.5X6 YLW CONV (MISCELLANEOUS) ×2 IMPLANT
PENCIL FOOT CONTROL (ELECTRODE) ×2 IMPLANT
SCISSORS WIRE DISP (INSTRUMENTS) ×2 IMPLANT
SCREW UPPER FACE 2.0X12MM (Screw) ×4 IMPLANT
SCREW UPPER FACE 2.0X8MM (Screw) ×4 IMPLANT
SUT CHROMIC 3 0 PS 2 (SUTURE) IMPLANT
SUT STEEL 0 (SUTURE)
SUT STEEL 0 18XMFL TIE 17 (SUTURE) IMPLANT
SUT STEEL 2 (SUTURE) IMPLANT
SUT STEEL 4 (SUTURE) IMPLANT
SUT VICRYL 4-0 PS2 18IN ABS (SUTURE) IMPLANT
TOWEL OR 17X24 6PK STRL BLUE (TOWEL DISPOSABLE) ×2 IMPLANT
TOWEL OR 17X26 10 PK STRL BLUE (TOWEL DISPOSABLE) ×2 IMPLANT
TRAY ENT MC OR (CUSTOM PROCEDURE TRAY) ×2 IMPLANT
WATER STERILE IRR 1000ML POUR (IV SOLUTION) ×2 IMPLANT
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
## 2012-02-26 NOTE — Progress Notes (Signed)
Patient ID: Miguel Vaughn, male   DOB: 1966/03/27, 46 y.o.   MRN: [id number] Having nausea with the hydrocodone. MMF is stable. Will adjust pain medicine. Anticipate D/C in am.

## 2012-02-26 NOTE — Anesthesia Postprocedure Evaluation (Signed)
  Anesthesia Post-op Note  Patient: Miguel Vaughn  Procedure(s) Performed: Procedure(s) (LRB): OPEN REDUCTION INTERNAL FIXATION (ORIF) MANDIBULAR FRACTURE (N/A)  Patient Location: PACU  Anesthesia Type: General  Level of Consciousness: awake, alert  and oriented  Airway and Oxygen Therapy: Patient Spontanous Breathing and Patient connected to nasal cannula oxygen  Post-op Pain: moderate  Post-op Assessment: Post-op Vital signs reviewed  Post-op Vital Signs: Reviewed  Complications: No apparent anesthesia complications

## 2012-02-26 NOTE — Anesthesia Preprocedure Evaluation (Addendum)
Anesthesia Evaluation  Patient identified by MRN, date of birth, ID band Patient awake    History of Anesthesia Complications Negative for: history of anesthetic complications  Airway Mallampati: III TM Distance: >3 FB Neck ROM: Full  Mouth opening: Limited Mouth Opening  Dental No notable dental hx. (+) Teeth Intact and Dental Advisory Given   Pulmonary neg pulmonary ROS,  breath sounds clear to auscultation  Pulmonary exam normal       Cardiovascular Exercise Tolerance: Good hypertension, Rhythm:Regular Rate:Normal     Neuro/Psych negative neurological ROS  negative psych ROS   GI/Hepatic negative GI ROS, Neg liver ROS,   Endo/Other  negative endocrine ROS  Renal/GU negative Renal ROS     Musculoskeletal negative musculoskeletal ROS (+)   Abdominal   Peds  Hematology negative hematology ROS (+)   Anesthesia Other Findings   Reproductive/Obstetrics                          Anesthesia Physical Anesthesia Plan  ASA: II  Anesthesia Plan: General   Post-op Pain Management:    Induction: Intravenous  Airway Management Planned: Nasal ETT  Additional Equipment:   Intra-op Plan:   Post-operative Plan: Extubation in OR  Informed Consent: I have reviewed the patients History and Physical, chart, labs and discussed the procedure including the risks, benefits and alternatives for the proposed anesthesia with the patient or authorized representative who has indicated his/her understanding and acceptance.   Dental advisory given  Plan Discussed with: CRNA, Anesthesiologist and Surgeon  Anesthesia Plan Comments:        Anesthesia Quick Evaluation

## 2012-02-26 NOTE — Transfer of Care (Signed)
Immediate Anesthesia Transfer of Care Note  Patient: Miguel Vaughn  Procedure(s) Performed: Procedure(s) (LRB): OPEN REDUCTION INTERNAL FIXATION (ORIF) MANDIBULAR FRACTURE (N/A)  Patient Location: PACU  Anesthesia Type: General  Level of Consciousness: awake and sedated  Airway & Oxygen Therapy: Patient Spontanous Breathing  Post-op Assessment: Report given to PACU RN, Post -op Vital signs reviewed and stable and Patient moving all extremities  Post vital signs: Reviewed and stable  Complications: No apparent anesthesia complications

## 2012-02-26 NOTE — Discharge Instructions (Signed)
Mandibular Fracture   You have a fracture of your mandible. This fracture is a break in the jaw bone. This is usually caused by trauma (a blow to the jaw).   HOME CARE INSTRUCTIONS   If your jaws are wired, learn how to release them quickly in case of vomiting or severe coughing. Keep the wire cutters in an easy to reach location or carry them with you.   Apply ice to the injury for 15 to 20 minutes each hour while awake for the first 2 days. Put the ice in a plastic bag and place a towel between the bag of ice and your skin.   Eat a well balanced high-protein (full) liquid diet while your jaw is wired. Your caregiver or dietician can help you with this.   Protect your jaw from pressure. Sleep on your back.   Avoid exercising to the point that you become short of breath.   Only take over-the-counter or prescription medicines for pain, discomfort, or fever as directed by your caregiver.   SEEK MEDICAL CARE IF:   You develop a severe headache or numbness in your face.   You have severe jaw pain unrelieved with medications.   The wires or splints become loose.   You have uncontrollable nausea (feeling sick to your stomach) or anxiety.   SEEK IMMEDIATE MEDICAL CARE IF:   You have a fever.   You have difficulty breathing.   You feel like your airway is closing or hear a high-pitched sound when you try and breathe.   Problems have forced you to cut the wires holding your upper and lower teeth together.   MAKE SURE YOU:   Understand these instructions.   Will watch your condition.   Will get help right away if you are not doing well or get worse.   Document Released: 09/20/2005 Document Revised: 09/09/2011 Document Reviewed: 05/10/2008   [employer] Patient Information 2012 [employer].

## 2012-02-26 NOTE — Op Note (Signed)
OPERATIVE REPORT  DATE OF SURGERY: 02/26/2012  PATIENT:  Miguel Vaughn,  46 y.o. male  PRE-OPERATIVE DIAGNOSIS:  Mandible Fracture  POST-OPERATIVE DIAGNOSIS:  Mandible Fracture  PROCEDURE:  Procedure(s): OPEN REDUCTION INTERNAL FIXATION (ORIF) MANDIBULAR FRACTURE  SURGEON:  Susy Frizzle, MD  ASSISTANTS: none   ANESTHESIA:   general  EBL:  5 ml  DRAINS: none   LOCAL MEDICATIONS USED:  NONE  SPECIMEN:  No Specimen  COUNTS:  YES  PROCEDURE DETAILS: The patient was taken to the operating room and placed on the operating table in the supine position. Following induction of general nasotracheal anesthesia, the mouth was draped in a standard fashion. The occlusion was checked. When the molars were felt to be in normal occlusion, he had a significant underbite. Electrocautery was used to incise the mucosa in 4 quadrants and the underlying bone was exposed. MMF screw were then placed, 2- 8 mm in the maxilla and 2- 12 mm in the mandible. The occlusion was set and 22 G wires were then used to secure the MMF, one wire on each side was all that was needed since there was no concern about side to side movement. The oral cavity was irrigated with saline. The patient was then awakened, extubated and transferred to PACU in stable condition.   PATIENT DISPOSITION:  PACU - hemodynamically stable.

## 2012-02-27 ENCOUNTER — Ambulatory Visit (HOSPITAL_COMMUNITY): Payer: Worker's Compensation

## 2012-02-27 MED ORDER — TRAMADOL 5 MG/ML ORAL SUSPENSION: 50.0000 mg | Freq: Four times a day (QID) | ORAL | Status: DC

## 2012-02-27 MED ORDER — TRAMADOL HCL 50 MG PO TABS: 50.0000 mg | ORAL_TABLET | Freq: Four times a day (QID) | ORAL | Status: AC | PRN

## 2012-02-27 NOTE — Progress Notes (Signed)
Patient ID: Miguel Vaughn, male   DOB: [birth year], 46 y.o.   MRN: [id number] Doing better, no more nausea or vomiting. MMF stable. D/C home after panorax.

## 2012-02-27 NOTE — Discharge Summary (Signed)
  Physician Discharge Summary  Patient ID: Miguel Vaughn MRN: [id number] DOB/AGE: [birth year] 46 y.o.  Admit date: 02/26/2012 Discharge date: 02/27/2012  Admission Diagnoses:Mandible fracture  Discharge Diagnoses:  Active Problems:  * No active hospital problems. *    Discharged Condition: good  Hospital Course: No complications  Consults: None  Significant Diagnostic Studies: radiology: panorax  Treatments: surgery: MMF  Discharge Exam: Blood pressure 143/77, pulse 60, temperature 97.3 F (36.3 C), temperature source Axillary, resp. rate 18, height 5\' 7"  (1.702 m), weight 200 lb (90.719 kg), SpO2 96.00%. PHYSICAL EXAM: MMF stable  Disposition: 01-Home or Self Care  Discharge Orders    Future Orders Please Complete By Expires   Increase activity slowly        Medication List  As of 02/27/2012  6:52 AM   STOP taking these medications         HYDROcodone-acetaminophen 7.5-500 MG/15ML solution         TAKE these medications         acetaminophen 325 MG tablet   Commonly known as: TYLENOL   Take 650 mg by mouth every 6 (six) hours as needed. For pain      clindamycin 300 MG capsule   Commonly known as: CLEOCIN   Take 1 capsule (300 mg total) by mouth 3 (three) times daily.      promethazine 25 MG suppository   Commonly known as: PHENERGAN   Place 1 suppository (25 mg total) rectally every 6 (six) hours as needed for nausea.      traMADol 5 mg/mL Susp   Commonly known as: ULTRAM   Take 10 mLs (50 mg total) by mouth every 6 (six) hours.      traMADol 50 MG tablet   Commonly known as: ULTRAM   Take 1 tablet (50 mg total) by mouth every 6 (six) hours as needed for pain.           Follow-up Information    Follow up with Serena Colonel, MD. Schedule an appointment as soon as possible for a visit in 1 week.   Contact information:   [address], [address] [place] [address] [phone]            Signed: Serena Colonel 02/27/2012, 6:52 AM

## 2012-02-27 NOTE — Progress Notes (Addendum)
Patient had panorax film done prior to discharge. Verbalized understanding of discharge instructions including use of wire cutters in the event of an emergency. Will make follow up appointments. Stated he purchased a smoothie to help increase caloric intake. Home medications gone over. Verbalized understanding of discharge instructions and medications. Prescriptions given.Also went over diet from [facility]' Consult with patient and printed it for his reference

## 2012-02-29 ENCOUNTER — Encounter (HOSPITAL_COMMUNITY): Payer: Self-pay | Admitting: Otolaryngology

## 2012-03-02 MED FILL — Lidocaine Inj 1% w/ Epinephrine-1:100000: INTRAMUSCULAR | Qty: 20 | Status: AC

## 2012-03-02 NOTE — ED Provider Notes (Signed)
Medical screening examination/treatment/procedure(s) were performed by non-physician practitioner and as supervising physician I was immediately available for consultation/collaboration.   Hurman Horn, MD 03/02/12 [phone]

## 2012-03-19 ENCOUNTER — Emergency Department (HOSPITAL_COMMUNITY)
Admission: EM | Admit: 2012-03-19 | Discharge: 2012-03-19 | Disposition: A | Discharge status: Home / Self Care | Payer: 59 | Attending: Emergency Medicine | Admitting: Emergency Medicine

## 2012-03-19 ENCOUNTER — Encounter (HOSPITAL_COMMUNITY): Payer: Self-pay | Admitting: Emergency Medicine

## 2012-03-19 ENCOUNTER — Emergency Department (HOSPITAL_COMMUNITY): Payer: 59

## 2012-03-19 DIAGNOSIS — I10 Essential (primary) hypertension: Secondary | ICD-10-CM | POA: Insufficient documentation

## 2012-03-19 DIAGNOSIS — N200 Calculus of kidney: Secondary | ICD-10-CM | POA: Insufficient documentation

## 2012-03-19 LAB — DIFFERENTIAL
Basophils Relative: 0 % (ref 0–1)
Eosinophils Absolute: 0.2 10*3/uL (ref 0.0–0.7)
Lymphs Abs: 1.5 10*3/uL (ref 0.7–4.0)
Monocytes Absolute: 0.7 10*3/uL (ref 0.1–1.0)
Monocytes Relative: 10 % (ref 3–12)
Neutro Abs: 4.2 10*3/uL (ref 1.7–7.7)

## 2012-03-19 LAB — HEPATIC FUNCTION PANEL
ALT: 19 U/L (ref 0–53)
AST: 18 U/L (ref 0–37)
Alkaline Phosphatase: 67 U/L (ref 39–117)
Indirect Bilirubin: 0.4 mg/dL (ref 0.3–0.9)
Total Protein: 7.7 g/dL (ref 6.0–8.3)

## 2012-03-19 LAB — CBC
HCT: 38.4 % — ABNORMAL LOW (ref 39.0–52.0)
Hemoglobin: 12.6 g/dL — ABNORMAL LOW (ref 13.0–17.0)
MCH: 26.9 pg (ref 26.0–34.0)
MCHC: 32.8 g/dL (ref 30.0–36.0)
RBC: 4.68 MIL/uL (ref 4.22–5.81)

## 2012-03-19 LAB — BASIC METABOLIC PANEL
BUN: 10 mg/dL (ref 6–23)
Chloride: 100 mEq/L (ref 96–112)
Glucose, Bld: 102 mg/dL — ABNORMAL HIGH (ref 70–99)
Potassium: 3.5 mEq/L (ref 3.5–5.1)

## 2012-03-19 LAB — URINALYSIS, ROUTINE W REFLEX MICROSCOPIC
Bilirubin Urine: NEGATIVE
Glucose, UA: NEGATIVE mg/dL
Specific Gravity, Urine: <1.005 — ABNORMAL LOW (ref 1.005–1.030)
Urobilinogen, UA: 0.2 mg/dL (ref 0.0–1.0)

## 2012-03-19 LAB — URINE MICROSCOPIC-ADD ON

## 2012-03-19 MED ORDER — KETOROLAC TROMETHAMINE 30 MG/ML IJ SOLN
30.0000 mg | Freq: Once | INTRAMUSCULAR | Status: AC
  Administered 2012-03-19: 30 mg via INTRAVENOUS
  Filled 2012-03-19: qty 1

## 2012-03-19 MED ORDER — ACETAMINOPHEN-CODEINE 120-12 MG/5ML PO SUSP: 10.0000 mL | Freq: Four times a day (QID) | ORAL | Status: AC | PRN

## 2012-03-19 MED ORDER — SODIUM CHLORIDE 0.9 % IV BOLUS (SEPSIS)
1000.0000 mL | Freq: Once | INTRAVENOUS | Status: AC
  Administered 2012-03-19: 1000 mL via INTRAVENOUS

## 2012-03-19 MED ORDER — TAMSULOSIN HCL 0.4 MG PO CAPS: 0.4000 mg | ORAL_CAPSULE | Freq: Every day | ORAL | Status: AC

## 2012-03-19 MED ORDER — HYDROMORPHONE HCL PF 1 MG/ML IJ SOLN
1.0000 mg | Freq: Once | INTRAMUSCULAR | Status: AC
  Administered 2012-03-19: 1 mg via INTRAVENOUS
  Filled 2012-03-19: qty 1

## 2012-03-19 NOTE — Discharge Instructions (Signed)

## 2012-03-19 NOTE — ED Provider Notes (Signed)
History     CSN: [id number]  Arrival date & time 03/19/12  1722   First MD Initiated Contact with Patient 03/19/12 1736      Chief Complaint  Patient presents with  . Abdominal Pain  . Flank Pain    (Consider location/radiation/quality/duration/timing/severity/associated sxs/prior treatment) HPI The patient presents with right-sided abdominal pain.  He notes that the pain began insidiously, in the hours prior to arrival.  Since onset the pain has been intermittent, though more present than not.  The pain is sharp, crampy, principally about the right mid abdomen with radiation towards the flank.  He denies any fevers, diarrhea.  He endorses nausea.  He denies vomiting.  He also denies any dysuria or hematuria.  He has a distant history of one kidney stone. The patient has recent surgical history of surgical fixation of the jaw secondary to fracture.  He denies any complications from this procedure. Past Medical History  Diagnosis Date  . Hypertension     Past Surgical History  Procedure Date  . Hernia repair   . Orif mandibular fracture 02/26/2012    Procedure: OPEN REDUCTION INTERNAL FIXATION (ORIF) MANDIBULAR FRACTURE;  Surgeon: Serena Colonel, MD;  Location: [facility] OR;  Service: ENT;  Laterality: N/A;    Family History  Problem Relation Age of Onset  . Hypertension Mother     History  Substance Use Topics  . Smoking status: Never Smoker   . Smokeless tobacco: Never Used  . Alcohol Use: No      Review of Systems  All other systems reviewed and are negative.    Allergies  Shellfish allergy and Clarithromycin  Home Medications   Current Outpatient Rx  Name Route Sig Dispense Refill  . ACETAMINOPHEN 325 MG PO TABS Oral Take 650 mg by mouth every 6 (six) hours as needed. For pain    . IBUPROFEN 600 MG PO TABS Oral Take 600 mg by mouth every 6 (six) hours as needed.     Marland Kitchen TRAMADOL HCL 50 MG PO TABS Oral Take 50 mg by mouth every 6 (six) hours as needed.    .  ACETAMINOPHEN-CODEINE 120-12 MG/5ML PO SUSP Oral Take 10 mLs by mouth every 6 (six) hours as needed for pain. 150 mL 0  . PROMETHAZINE HCL 25 MG RE SUPP Rectal Place 1 suppository (25 mg total) rectally every 6 (six) hours as needed for nausea. 12 each 0  . TAMSULOSIN HCL 0.4 MG PO CAPS Oral Take 1 capsule (0.4 mg total) by mouth daily. 30 capsule 0    BP 134/96  Pulse 69  Temp 99.1 F (37.3 C) (Axillary)  Resp 16  Ht 5\' 7"  (1.702 m)  Wt 185 lb (83.915 kg)  BMI 28.97 kg/m2  SpO2 98%  Physical Exam  Nursing note and vitals reviewed. Constitutional: He is oriented to person, place, and time. He appears well-developed. No distress.  HENT:  Head: Normocephalic and atraumatic.       Patient has wired jaw  Eyes: Conjunctivae and EOM are normal.  Cardiovascular: Normal rate and regular rhythm.   Pulmonary/Chest: Effort normal. No stridor. No respiratory distress.  Abdominal: He exhibits no distension. There is tenderness in the right upper quadrant. There is no rigidity, no rebound and no guarding.  Musculoskeletal: He exhibits no edema.  Neurological: He is alert and oriented to person, place, and time.  Skin: Skin is warm and dry.  Psychiatric: He has a normal mood and affect.    ED Course  Procedures (  including critical care time)  Labs Reviewed  BASIC METABOLIC PANEL - Abnormal; Notable for the following:    Glucose, Bld 102 (*)     GFR calc non Af Amer 72 (*)     GFR calc Af Amer 83 (*)     All other components within normal limits  CBC - Abnormal; Notable for the following:    Hemoglobin 12.6 (*)     HCT 38.4 (*)     All other components within normal limits  URINALYSIS, ROUTINE W REFLEX MICROSCOPIC - Abnormal; Notable for the following:    Specific Gravity, Urine <1.005 (*)     Hgb urine dipstick LARGE (*)     Leukocytes, UA TRACE (*)     All other components within normal limits  URINE MICROSCOPIC-ADD ON - Abnormal; Notable for the following:    Squamous Epithelial  / LPF FEW (*)     Bacteria, UA FEW (*)     All other components within normal limits  DIFFERENTIAL  LIPASE, BLOOD  HEPATIC FUNCTION PANEL   Dg Abd 1 View  03/19/2012  *RADIOLOGY REPORT*  Clinical Data: Lower mid abdominal pain  ABDOMEN - 1 VIEW  Comparison: None.  Findings: Presence or absence of air fluid levels or free air cannot be assessed on this single supine view.  No dilated gas filled loop of bowel.  No abnormal calcific opacity.  No acute osseous finding.  IMPRESSION: No acute intra-abdominal pathology.  Original Report Authenticated By: Harrel Lemon, M.D.     1. Kidney stones     I reviewed all labs / studies.  On re-eval the patient is asymptomatic.  MDM  This generally well male presents with new right-sided pain that radiates to the flank, and on initial exam is in no distress.  The patient's symptoms resolved following ED interventions.  Given the patient's history of prior stones, there is some suspicion of this pathology.  GI pathology was considered as well.  The patient's labs are notable for hematuria, the absence of leukocytosis.the patient is also afebrile.  Although there is mild suggestion of infection from his urine, this is likely secondary to stone effects.  A urine culture was sent and the patient was made aware of return precautions on discharge.  Gerhard Munch, MD 03/19/12 1925

## 2012-03-19 NOTE — ED Notes (Signed)
Patient c/o pain that starts at navel on right side and radiates to right flank. Patient denies any fevers or diarrhea but reports nausea.

## 2012-03-21 LAB — URINE CULTURE
Colony Count: NO GROWTH
Culture  Setup Time: 201306162258

## 2012-04-04 ENCOUNTER — Other Ambulatory Visit (HOSPITAL_COMMUNITY): Payer: Self-pay | Admitting: Otolaryngology

## 2012-04-04 ENCOUNTER — Ambulatory Visit (HOSPITAL_COMMUNITY)
Admission: RE | Admit: 2012-04-04 | Discharge: 2012-04-04 | Disposition: A | Discharge status: Home / Self Care | Payer: PRIVATE HEALTH INSURANCE | Source: Ambulatory Visit | Attending: Otolaryngology | Admitting: Otolaryngology

## 2012-04-04 DIAGNOSIS — S02640A Fracture of ramus of mandible, unspecified side, initial encounter for closed fracture: Secondary | ICD-10-CM

## 2012-04-08 ENCOUNTER — Emergency Department (HOSPITAL_COMMUNITY)
Admission: EM | Admit: 2012-04-08 | Discharge: 2012-04-09 | Disposition: A | Discharge status: Home / Self Care | Payer: 59 | Attending: Emergency Medicine | Admitting: Emergency Medicine

## 2012-04-08 ENCOUNTER — Encounter (HOSPITAL_COMMUNITY): Payer: Self-pay | Admitting: *Deleted

## 2012-04-08 DIAGNOSIS — N201 Calculus of ureter: Secondary | ICD-10-CM | POA: Insufficient documentation

## 2012-04-08 DIAGNOSIS — R109 Unspecified abdominal pain: Secondary | ICD-10-CM | POA: Insufficient documentation

## 2012-04-08 DIAGNOSIS — N200 Calculus of kidney: Secondary | ICD-10-CM

## 2012-04-08 DIAGNOSIS — I10 Essential (primary) hypertension: Secondary | ICD-10-CM | POA: Insufficient documentation

## 2012-04-08 HISTORY — DX: Calculus of kidney: N20.0

## 2012-04-08 MED ORDER — HYDROMORPHONE HCL PF 1 MG/ML IJ SOLN
1.0000 mg | Freq: Once | INTRAMUSCULAR | Status: AC
  Administered 2012-04-09: 1 mg via INTRAVENOUS
  Filled 2012-04-08: qty 1

## 2012-04-08 MED ORDER — SODIUM CHLORIDE 0.9 % IV SOLN
Freq: Once | INTRAVENOUS | Status: AC
  Administered 2012-04-09: via INTRAVENOUS

## 2012-04-08 MED ORDER — KETOROLAC TROMETHAMINE 30 MG/ML IJ SOLN
30.0000 mg | Freq: Once | INTRAMUSCULAR | Status: AC
  Administered 2012-04-09: 30 mg via INTRAVENOUS
  Filled 2012-04-08: qty 1

## 2012-04-08 MED ORDER — ONDANSETRON HCL 4 MG/2ML IJ SOLN
4.0000 mg | Freq: Once | INTRAMUSCULAR | Status: AC
  Administered 2012-04-09: 4 mg via INTRAVENOUS
  Filled 2012-04-08: qty 2

## 2012-04-08 NOTE — ED Notes (Signed)
Pt states pain started yesterday and increased tonight; Denies nausea and vomiting; Pt unable to take po pain meds he had when he was dx with kidney stones June 17

## 2012-04-09 ENCOUNTER — Emergency Department (HOSPITAL_COMMUNITY): Payer: 59

## 2012-04-09 LAB — URINALYSIS, ROUTINE W REFLEX MICROSCOPIC
Glucose, UA: NEGATIVE mg/dL
Ketones, ur: NEGATIVE mg/dL
Leukocytes, UA: NEGATIVE
Nitrite: NEGATIVE
Specific Gravity, Urine: >1.03 — ABNORMAL HIGH (ref 1.005–1.030)
pH: 6 (ref 5.0–8.0)

## 2012-04-09 LAB — URINE MICROSCOPIC-ADD ON

## 2012-04-09 MED ORDER — MORPHINE SULFATE (CONCENTRATE) 20 MG/ML PO SOLN: ORAL | Status: AC

## 2012-04-09 MED ORDER — CIPROFLOXACIN HCL 250 MG PO TABS: 500.0000 mg | ORAL_TABLET | Freq: Once | ORAL | Status: DC

## 2012-04-09 MED ORDER — HYDROMORPHONE HCL PF 1 MG/ML IJ SOLN
0.5000 mg | Freq: Once | INTRAMUSCULAR | Status: AC
  Administered 2012-04-09: 0.5 mg via INTRAVENOUS
  Filled 2012-04-09: qty 1

## 2012-04-09 MED ORDER — HYDROMORPHONE HCL PF 1 MG/ML IJ SOLN
0.5000 mg | Freq: Once | INTRAMUSCULAR | Status: AC
  Administered 2012-04-09: 1 mg via INTRAVENOUS
  Filled 2012-04-09: qty 1

## 2012-04-09 MED ORDER — CEPHALEXIN 250 MG/5ML PO SUSR: 500.0000 mg | Freq: Two times a day (BID) | ORAL | Status: AC

## 2012-04-09 MED ORDER — ONDANSETRON HCL 4 MG/2ML IJ SOLN
4.0000 mg | Freq: Once | INTRAMUSCULAR | Status: AC
  Administered 2012-04-09: 4 mg via INTRAVENOUS
  Filled 2012-04-09: qty 2

## 2012-04-09 MED ORDER — CIPROFLOXACIN IN D5W 400 MG/200ML IV SOLN
400.0000 mg | Freq: Once | INTRAVENOUS | Status: AC
  Administered 2012-04-09: 400 mg via INTRAVENOUS
  Filled 2012-04-09: qty 200

## 2012-04-09 NOTE — ED Notes (Signed)
Pt instructed to finish antibiotics until all gone; also instructed to follow up with MD if problems persist or to return back here to the ED if needed; Kidney Stone education provided and also warning signs of complications provided; Pt verbalized understanding; Pt also instructed to carry the wire cutters for his jaw (wired from sx) in case of vomiting, Informed pt that vomiting may occur with this condition. Pt states understanding and states that he will carry the cutters. Pt also instructed not to drive while on pain medication; and also educated about dosing of the pain medication. Verbalizes understanding and repeated back information

## 2012-04-09 NOTE — ED Provider Notes (Signed)
History     CSN: [id number]  Arrival date & time 04/08/12  2327   First MD Initiated Contact with Patient 04/08/12 2332      Chief Complaint  Patient presents with  . Flank Pain    Also testicular and bladder pain    (Consider location/radiation/quality/duration/timing/severity/associated sxs/prior treatment) HPI  AMROM ORE is a 46 y.o. male who presents to the Emergency Department complaining of right sided mild abdominal pain with radiation to the right flank that has been present for several days, worse tonight. He was seen 03/20/12 for similar discomfort and it was felt he had a kidney stone. No CT was done for confirmation at that time. He denies hematuria, nausea, vomiting. The patient has recent surgical history of surgical fixation of the jaw secondary to fracture resulting from a fall. He is unable to take pills.  PCP Dr. Drue Novel  Past Medical History  Diagnosis Date  . Hypertension   . Kidney stones     Past Surgical History  Procedure Date  . Hernia repair   . Orif mandibular fracture 02/26/2012    Procedure: OPEN REDUCTION INTERNAL FIXATION (ORIF) MANDIBULAR FRACTURE;  Surgeon: Serena Colonel, MD;  Location: [facility] OR;  Service: ENT;  Laterality: N/A;    Family History  Problem Relation Age of Onset  . Hypertension Mother     History  Substance Use Topics  . Smoking status: Never Smoker   . Smokeless tobacco: Never Used  . Alcohol Use: No      Review of Systems  Constitutional: Negative for fever.       10 Systems reviewed and are negative for acute change except as noted in the HPI.  HENT: Negative for congestion.   Eyes: Negative for discharge and redness.  Respiratory: Negative for cough and shortness of breath.   Cardiovascular: Negative for chest pain.  Gastrointestinal: Negative for vomiting and abdominal pain.  Musculoskeletal: Negative for back pain.  Skin: Negative for rash.  Neurological: Negative for syncope, numbness and headaches.    Psychiatric/Behavioral:       No behavior change.    Allergies  Shellfish allergy and Clarithromycin  Home Medications   Current Outpatient Rx  Name Route Sig Dispense Refill  . ACETAMINOPHEN 325 MG PO TABS Oral Take 650 mg by mouth every 6 (six) hours as needed. For pain    . IBUPROFEN 600 MG PO TABS Oral Take 600 mg by mouth every 6 (six) hours as needed.     Marland Kitchen PROMETHAZINE HCL 25 MG RE SUPP Rectal Place 1 suppository (25 mg total) rectally every 6 (six) hours as needed for nausea. 12 each 0  . TAMSULOSIN HCL 0.4 MG PO CAPS Oral Take 1 capsule (0.4 mg total) by mouth daily. 30 capsule 0  . TRAMADOL HCL 50 MG PO TABS Oral Take 50 mg by mouth every 6 (six) hours as needed.      BP 134/92  Pulse 72  Temp 98.3 F (36.8 C) (Oral)  Resp 20  Ht 5\' 7"  (1.702 m)  Wt 169 lb (76.658 kg)  BMI 26.47 kg/m2  SpO2 97%  Physical Exam  Nursing note and vitals reviewed. Constitutional: He is oriented to person, place, and time. He appears well-developed and well-nourished.       Awake, alert, nontoxic appearance.  HENT:  Head: Atraumatic.       Jaw wired, unable to open mouth  Eyes: Right eye exhibits no discharge. Left eye exhibits no discharge.  Neck: Normal range  of motion. Neck supple.  Cardiovascular: Normal heart sounds.   Pulmonary/Chest: Effort normal and breath sounds normal. He exhibits no tenderness.  Abdominal: Soft. Bowel sounds are normal. There is no tenderness. There is no rebound.  Genitourinary:       Mild right cva tenderness to percussion  Musculoskeletal: He exhibits no tenderness.       Baseline ROM, no obvious new focal weakness.  Neurological: He is alert and oriented to person, place, and time.       Mental status and motor strength appears baseline for patient and situation.  Skin: Skin is warm and dry. No rash noted.  Psychiatric: He has a normal mood and affect.    ED Course  Procedures (including critical care time) Results for orders placed during  the hospital encounter of 04/08/12  URINALYSIS, ROUTINE W REFLEX MICROSCOPIC      Component Value Range   Color, Urine YELLOW  YELLOW   APPearance CLEAR  CLEAR   Specific Gravity, Urine >1.030 (*) 1.005 - 1.030   pH 6.0  5.0 - 8.0   Glucose, UA NEGATIVE  NEGATIVE mg/dL   Hgb urine dipstick LARGE (*) NEGATIVE   Bilirubin Urine NEGATIVE  NEGATIVE   Ketones, ur NEGATIVE  NEGATIVE mg/dL   Protein, ur 30 (*) NEGATIVE mg/dL   Urobilinogen, UA 1.0  0.0 - 1.0 mg/dL   Nitrite NEGATIVE  NEGATIVE   Leukocytes, UA NEGATIVE  NEGATIVE  URINE MICROSCOPIC-ADD ON      Component Value Range   Squamous Epithelial / LPF RARE  RARE   WBC, UA 0-2  <3 WBC/hpf   RBC / HPF 21-50  <3 RBC/hpf   Bacteria, UA FEW (*) RARE   Crystals CA OXALATE CRYSTALS (*) NEGATIVE   Ct Abdomen Pelvis Wo Contrast  04/09/2012  *RADIOLOGY REPORT*  Clinical Data: Rule out right-sided stone  CT ABDOMEN AND PELVIS WITHOUT CONTRAST  Technique:  Multidetector CT imaging of the abdomen and pelvis was performed following the standard protocol without intravenous contrast.  Comparison: None.  Findings: Lung base are clear.  No pericardial fluid.  No focal hepatic lesion.  The gallbladder, pancreas, spleen, and adrenal glands  are normal.  There is hydroureter and mild hydronephrosis on the right.  This secondary to an obstructing calculus in the distal right ureter at the vesicoureteral junction measuring 4 mm (image 69).  The stomach, small bowel, appendix, and colon are normal.  Abdominal aorta normal caliber.  No retroperitoneal periportal lymphadenopathy.  No free fluid the pelvis.  Prostate gland is normal. Review of bone windows demonstrates no aggressive osseous lesions.  IMPRESSION:   Partially obstructing calculus within the distal right ureter at the vesicoureteral junction.  Original Report Authenticated By: Genevive Bi, M.D.      MDM  Patient with remote h/o kidney stone and recent visit to the ER 03/20/12 with symptoms  suspicious for stone. Here tonight with right flank pain with radiation to right abdomen. UA with Ca oxaylate crystals and hematuria.CT shows an obstructing 4 mm stone at the UVJ. Give IVF, analgesic and antiemetic. Initiated antibiotic therapy. Referral to urology. Dx testing d/w pt and family.  Questions answered.  Verb understanding, agreeable to d/c home with outpt f/u. Pt feels improved after observation and/or treatment in ED.Pt stable in ED with no significant deterioration in condition.The patient appears reasonably screened and/or stabilized for discharge and I doubt any other medical condition or other [facility] requiring further screening, evaluation, or treatment in the ED at this  time prior to discharge.  MDM Reviewed: nursing note, vitals and previous chart Interpretation: CT scan and labs            [facility]. Colon Branch, MD 04/09/12 0981

## 2012-04-09 NOTE — ED Notes (Signed)
Reports nausea now relieved

## 2012-04-11 ENCOUNTER — Other Ambulatory Visit: Payer: Self-pay | Admitting: Otolaryngology

## 2012-04-11 ENCOUNTER — Ambulatory Visit (HOSPITAL_COMMUNITY)
Admission: RE | Admit: 2012-04-11 | Discharge: 2012-04-11 | Disposition: A | Discharge status: Home / Self Care | Payer: Worker's Compensation | Source: Ambulatory Visit | Attending: Otolaryngology | Admitting: Otolaryngology

## 2012-04-11 DIAGNOSIS — S02640A Fracture of ramus of mandible, unspecified side, initial encounter for closed fracture: Secondary | ICD-10-CM | POA: Insufficient documentation

## 2012-04-11 DIAGNOSIS — X58XXXA Exposure to other specified factors, initial encounter: Secondary | ICD-10-CM | POA: Insufficient documentation

## 2012-04-12 ENCOUNTER — Encounter (HOSPITAL_BASED_OUTPATIENT_CLINIC_OR_DEPARTMENT_OTHER): Payer: Self-pay | Admitting: *Deleted

## 2012-04-12 NOTE — Progress Notes (Signed)
No labs needed Al preop instructions reviewed

## 2012-04-14 ENCOUNTER — Ambulatory Visit (HOSPITAL_BASED_OUTPATIENT_CLINIC_OR_DEPARTMENT_OTHER): Payer: Worker's Compensation | Admitting: Anesthesiology

## 2012-04-14 ENCOUNTER — Encounter (HOSPITAL_BASED_OUTPATIENT_CLINIC_OR_DEPARTMENT_OTHER)
Admission: RE | Disposition: A | Discharge status: Home / Self Care | Payer: Self-pay | Source: Ambulatory Visit | Attending: Otolaryngology

## 2012-04-14 ENCOUNTER — Encounter (HOSPITAL_BASED_OUTPATIENT_CLINIC_OR_DEPARTMENT_OTHER): Payer: Self-pay

## 2012-04-14 ENCOUNTER — Ambulatory Visit (HOSPITAL_BASED_OUTPATIENT_CLINIC_OR_DEPARTMENT_OTHER)
Admission: RE | Admit: 2012-04-14 | Discharge: 2012-04-14 | Disposition: A | Discharge status: Home / Self Care | Payer: Worker's Compensation | Source: Ambulatory Visit | Attending: Otolaryngology | Admitting: Otolaryngology

## 2012-04-14 ENCOUNTER — Encounter (HOSPITAL_BASED_OUTPATIENT_CLINIC_OR_DEPARTMENT_OTHER): Payer: Self-pay | Admitting: Anesthesiology

## 2012-04-14 DIAGNOSIS — I10 Essential (primary) hypertension: Secondary | ICD-10-CM | POA: Insufficient documentation

## 2012-04-14 DIAGNOSIS — S02609A Fracture of mandible, unspecified, initial encounter for closed fracture: Secondary | ICD-10-CM

## 2012-04-14 DIAGNOSIS — Z472 Encounter for removal of internal fixation device: Secondary | ICD-10-CM | POA: Insufficient documentation

## 2012-04-14 HISTORY — PX: MANDIBULAR HARDWARE REMOVAL: SHX5205

## 2012-04-14 SURGERY — REMOVAL, HARDWARE, MANDIBLE
Anesthesia: Monitor Anesthesia Care | Site: Mouth | Wound class: Clean Contaminated

## 2012-04-14 MED ORDER — LACTATED RINGERS IV SOLN
INTRAVENOUS | Status: DC
  Administered 2012-04-14 (×3): via INTRAVENOUS

## 2012-04-14 MED ORDER — FENTANYL CITRATE 0.05 MG/ML IJ SOLN
INTRAMUSCULAR | Status: DC | PRN
  Administered 2012-04-14: 50 ug via INTRAVENOUS

## 2012-04-14 MED ORDER — BACITRACIN-NEOMYCIN-POLYMYXIN 400-5-5000 EX OINT
TOPICAL_OINTMENT | CUTANEOUS | Status: DC | PRN
  Administered 2012-04-14: 1 via TOPICAL

## 2012-04-14 MED ORDER — LIDOCAINE-EPINEPHRINE 1 %-1:100000 IJ SOLN
INTRAMUSCULAR | Status: DC | PRN
  Administered 2012-04-14: 6 mL

## 2012-04-14 MED ORDER — HYDROMORPHONE HCL PF 1 MG/ML IJ SOLN
0.2500 mg | INTRAMUSCULAR | Status: DC | PRN
  Administered 2012-04-14: 0.5 mg via INTRAVENOUS
  Administered 2012-04-14: 0.25 mg via INTRAVENOUS
  Administered 2012-04-14: 0.5 mg via INTRAVENOUS
  Administered 2012-04-14: 0.25 mg via INTRAVENOUS
  Administered 2012-04-14: 0.5 mg via INTRAVENOUS
  Administered 2012-04-14: 0.25 mg via INTRAVENOUS

## 2012-04-14 MED ORDER — ONDANSETRON HCL 4 MG/2ML IJ SOLN
INTRAMUSCULAR | Status: DC | PRN
  Administered 2012-04-14: 4 mg via INTRAVENOUS

## 2012-04-14 MED ORDER — PROPOFOL 10 MG/ML IV EMUL
INTRAVENOUS | Status: DC | PRN
  Administered 2012-04-14: 50 ug/kg/min via INTRAVENOUS

## 2012-04-14 MED ORDER — 0.9 % SODIUM CHLORIDE (POUR BTL) OPTIME
TOPICAL | Status: DC | PRN
  Administered 2012-04-14: 25 mL

## 2012-04-14 MED ORDER — MIDAZOLAM HCL 5 MG/5ML IJ SOLN
INTRAMUSCULAR | Status: DC | PRN
  Administered 2012-04-14: 2 mg via INTRAVENOUS

## 2012-04-14 MED ORDER — DROPERIDOL 2.5 MG/ML IJ SOLN: 0.6250 mg | INTRAMUSCULAR | Status: DC | PRN

## 2012-04-14 SURGICAL SUPPLY — 30 items
BLADE SURG 15 STRL LF DISP TIS (BLADE) ×1 IMPLANT
BLADE SURG 15 STRL SS (BLADE) ×1
CANISTER SUCTION 1200CC (MISCELLANEOUS) ×2 IMPLANT
CLOTH BEACON ORANGE TIMEOUT ST (SAFETY) ×2 IMPLANT
COVER MAYO STAND STRL (DRAPES) ×2 IMPLANT
DECANTER SPIKE VIAL GLASS SM (MISCELLANEOUS) ×2 IMPLANT
ELECT COATED BLADE 2.86 ST (ELECTRODE) IMPLANT
ELECT REM PT RETURN 9FT ADLT (ELECTROSURGICAL)
ELECTRODE REM PT RTRN 9FT ADLT (ELECTROSURGICAL) IMPLANT
GAUZE SPONGE 4X4 12PLY STRL LF (GAUZE/BANDAGES/DRESSINGS) IMPLANT
GAUZE SPONGE 4X4 16PLY XRAY LF (GAUZE/BANDAGES/DRESSINGS) IMPLANT
GLOVE ECLIPSE 7.5 STRL STRAW (GLOVE) ×2 IMPLANT
GLOVE SKINSENSE NS SZ7.0 (GLOVE) ×1
GLOVE SKINSENSE STRL SZ7.0 (GLOVE) ×1 IMPLANT
GOWN PREVENTION PLUS XLARGE (GOWN DISPOSABLE) ×2 IMPLANT
MARKER SKIN DUAL TIP RULER LAB (MISCELLANEOUS) IMPLANT
NEEDLE 27GAX1X1/2 (NEEDLE) ×2 IMPLANT
NS IRRIG 1000ML POUR BTL (IV SOLUTION) IMPLANT
PACK BASIN DAY SURGERY FS (CUSTOM PROCEDURE TRAY) ×2 IMPLANT
PENCIL FOOT CONTROL (ELECTRODE) IMPLANT
SCISSORS WIRE ANG 4 3/4 DISP (INSTRUMENTS) IMPLANT
SHEET MEDIUM DRAPE 40X70 STRL (DRAPES) ×2 IMPLANT
SUT CHROMIC 3 0 PS 2 (SUTURE) IMPLANT
SUT CHROMIC 4 0 PS 2 18 (SUTURE) ×2 IMPLANT
SYR CONTROL 10ML LL (SYRINGE) ×2 IMPLANT
TOWEL OR 17X24 6PK STRL BLUE (TOWEL DISPOSABLE) ×2 IMPLANT
TRAY DSU PREP LF (CUSTOM PROCEDURE TRAY) IMPLANT
TUBE CONNECTING 20X1/4 (TUBING) ×2 IMPLANT
WATER STERILE IRR 1000ML POUR (IV SOLUTION) IMPLANT
YANKAUER SUCT BULB TIP NO VENT (SUCTIONS) ×2 IMPLANT

## 2012-04-14 NOTE — Anesthesia Postprocedure Evaluation (Signed)
  Anesthesia Post-op Note  Patient: Miguel Vaughn  Procedure(s) Performed: Procedure(s) (LRB): MANDIBULAR HARDWARE REMOVAL (N/A)  Patient Location: PACU  Anesthesia Type: MAC  Level of Consciousness: awake, alert  and oriented  Airway and Oxygen Therapy: Patient Spontanous Breathing  Post-op Pain: mild  Post-op Assessment: Post-op Vital signs reviewed  Post-op Vital Signs: Reviewed  Complications: No apparent anesthesia complications

## 2012-04-14 NOTE — Op Note (Signed)
OPERATIVE REPORT  DATE OF SURGERY: 04/14/2012  PATIENT:  Miguel Vaughn,  46 y.o. male  PRE-OPERATIVE DIAGNOSIS:  post mandible fracture  POST-OPERATIVE DIAGNOSIS:  post mandible fracture  PROCEDURE:  Procedure(s): MANDIBULAR HARDWARE REMOVAL  SURGEON:  Susy Frizzle, MD  ASSISTANTS: none   ANESTHESIA:   Local with IV sedation  EBL:  10 ml  DRAINS: none   LOCAL MEDICATIONS USED:  OTHER lidocaine with epinephrine solution  SPECIMEN:  No Specimen  COUNTS:  YES  PROCEDURE DETAILS: Patient taken to the operating room and placed on the operating table in the supine position. Following induction of intravenous sedation the patient was draped in a standard fashion. A [place] retractors used to expose the oral cavity. The inter dental wires were cut and trimmed. Xylocaine with epinephrine was infiltrated in 4 quadrants overlying screws. A 15 scalpel was used to incise the mucosa and a Therapist, nutritional was used to expose all 4 screws. The wires were removed and the screws were then removed as well. The wounds were irrigated. 4-0 chromic suture was used in the mandibular side and both sides. The patient was then transferred to recovery in stable condition.   PATIENT DISPOSITION:  PACU - hemodynamically stable.

## 2012-04-14 NOTE — Transfer of Care (Signed)
Immediate Anesthesia Transfer of Care Note  Patient: Miguel Vaughn  Procedure(s) Performed: Procedure(s) (LRB): MANDIBULAR HARDWARE REMOVAL (N/A)  Patient Location: PACU  Anesthesia Type: MAC  Level of Consciousness: sedated and patient cooperative  Airway & Oxygen Therapy: Patient Spontanous Breathing and Patient connected to face mask oxygen  Post-op Assessment: Report given to PACU RN and Post -op Vital signs reviewed and stable  Post vital signs: Reviewed and stable  Complications: No apparent anesthesia complications

## 2012-04-14 NOTE — Anesthesia Preprocedure Evaluation (Signed)
Anesthesia Evaluation  Patient identified by MRN, date of birth, ID band Patient awake    Reviewed: Allergy & Precautions, H&P , NPO status , Patient's Chart, lab work & pertinent test results  History of Anesthesia Complications Negative for: history of anesthetic complications  Airway   Neck ROM: Full  Mouth opening: Limited Mouth Opening  Dental  (+) Dental Advisory Given   Pulmonary neg pulmonary ROS,  breath sounds clear to auscultation  Pulmonary exam normal       Cardiovascular Rhythm:Regular Rate:Normal     Neuro/Psych    GI/Hepatic negative GI ROS, Neg liver ROS,   Endo/Other  negative endocrine ROS  Renal/GU negative Renal ROS     Musculoskeletal   Abdominal   Peds  Hematology negative hematology ROS (+)   Anesthesia Other Findings   Reproductive/Obstetrics                           Anesthesia Physical Anesthesia Plan  ASA: II  Anesthesia Plan: MAC   Post-op Pain Management:    Induction: Intravenous  Airway Management Planned: Mask  Additional Equipment:   Intra-op Plan:   Post-operative Plan:   Informed Consent: I have reviewed the patients History and Physical, chart, labs and discussed the procedure including the risks, benefits and alternatives for the proposed anesthesia with the patient or authorized representative who has indicated his/her understanding and acceptance.   Dental advisory given  Plan Discussed with: CRNA, Anesthesiologist and Surgeon  Anesthesia Plan Comments:         Anesthesia Quick Evaluation

## 2012-04-14 NOTE — H&P (Signed)
  Miguel Vaughn is an 46 y.o. male.   Chief Complaint: Mandible fracture HPI: mandible fracture treated with MMF 6 weeks ago.  Past Medical History  Diagnosis Date  . Hypertension     no meds  . Kidney stones     Past Surgical History  Procedure Date  . Hernia repair   . Orif mandibular fracture 02/26/2012    Procedure: OPEN REDUCTION INTERNAL FIXATION (ORIF) MANDIBULAR FRACTURE;  Surgeon: Serena Colonel, MD;  Location: [facility] OR;  Service: ENT;  Laterality: N/A;    Family History  Problem Relation Age of Onset  . Hypertension Mother    Social History:  reports that he has never smoked. He has never used smokeless tobacco. He reports that he does not drink alcohol or use illicit drugs.  Allergies:  Allergies  Allergen Reactions  . Shellfish Allergy Anaphylaxis, Hives and Swelling  . Clarithromycin Nausea And Vomiting    Medications Prior to Admission  Medication Sig Dispense Refill  . acetaminophen (TYLENOL) 325 MG tablet Take 650 mg by mouth every 6 (six) hours as needed. For pain      . cephALEXin (KEFLEX) 250 MG/5ML suspension Take 10 mLs (500 mg total) by mouth 2 (two) times daily.  200 mL  0  . ibuprofen (ADVIL,MOTRIN) 600 MG tablet Take 600 mg by mouth every 6 (six) hours as needed.       . Tamsulosin HCl (FLOMAX) 0.4 MG CAPS Take 1 capsule (0.4 mg total) by mouth daily.  30 capsule  0  . traMADol (ULTRAM) 50 MG tablet Take 50 mg by mouth every 6 (six) hours as needed.      Marland Kitchen morphine (ROXANOL) 20 MG/ML concentrated solution Take 0.5 mls (10 mg total) every 4 hours as needed for pain  15 mL  0  . promethazine (PHENERGAN) 25 MG suppository Place 1 suppository (25 mg total) rectally every 6 (six) hours as needed for nausea.  12 each  0    No results found for this or any previous visit (from the past 48 hour(s)). No results found.  ROS: otherwise negative  Blood pressure 118/85, pulse 64, temperature 98.5 F (36.9 C), temperature source Oral, resp. rate 18, SpO2  100.00%.  PHYSICAL EXAM: Overall appearance:  Healthy appearing, in no distress Head:  Normocephalic, atraumatic. Ears: External auditory canals are clear; tympanic membranes are intact and the middle ears are free of any effusion. Nose: External nose is healthy in appearance. Internal nasal exam free of any lesions or obstruction. Oral Cavity:  MMF in place with good occlusion. Neuro:  No identifiable neurologic deficits. Neck: No palpable neck masses.  Studies Reviewed: none    Assessment/Plan MMF removal today.  Allene Furuya 04/14/2012, 9:45 AM

## 2012-04-17 ENCOUNTER — Encounter (HOSPITAL_BASED_OUTPATIENT_CLINIC_OR_DEPARTMENT_OTHER): Payer: Self-pay | Admitting: Otolaryngology

## 2017-07-22 ENCOUNTER — Telehealth: Payer: Self-pay | Admitting: *Deleted

## 2017-07-22 NOTE — Telephone Encounter (Signed)
[facility] [place]; forwarded to [email or web address] for email/scan/SLS 10/19

## 2022-02-20 ENCOUNTER — Ambulatory Visit
Admission: EM | Admit: 2022-02-20 | Discharge: 2022-02-20 | Disposition: A | Discharge status: Home / Self Care | Payer: 59 | Attending: Internal Medicine | Admitting: Internal Medicine

## 2022-02-20 DIAGNOSIS — M791 Myalgia, unspecified site: Secondary | ICD-10-CM

## 2022-02-20 DIAGNOSIS — W57XXXA Bitten or stung by nonvenomous insect and other nonvenomous arthropods, initial encounter: Secondary | ICD-10-CM | POA: Diagnosis not present

## 2022-02-20 MED ORDER — DOXYCYCLINE HYCLATE 100 MG PO CAPS: 100.0000 mg | ORAL_CAPSULE | Freq: Two times a day (BID) | ORAL | 0 refills | Status: AC

## 2022-02-20 MED ORDER — DOXYCYCLINE HYCLATE 100 MG PO CAPS: 100.0000 mg | ORAL_CAPSULE | Freq: Two times a day (BID) | ORAL | 0 refills | Status: DC

## 2022-02-20 NOTE — Discharge Instructions (Signed)
You have been prescribed doxycycline which will treat tick bite infection.  We have performed several blood work tests on you and we will call if there are any abnormalities.  Please go to the hospital if symptoms persist or worsen.

## 2022-02-20 NOTE — ED Triage Notes (Signed)
Pt c/o 2 tic bites noticed ~ 2 days ago. States he "don't feel like myself" has been more fatigue and body soreness. Has noticed skin rashes as well.

## 2022-02-20 NOTE — ED Provider Notes (Signed)
[name]    CSN: [id number] Arrival date & time: 02/20/22  0913      History   Chief Complaint Chief Complaint  Patient presents with   Tick Removal   Tics    HPI Miguel Vaughn is a 56 y.o. male.   Patient presents for further evaluation after removing 2 ticks from his body about 2 days ago.  Patient reports the tick was present in the left axillary area directly above left breast as well as in right posterior thigh/buttocks area.  He removed the ticks fully with head intact.  He has developed body aches, fatigue, headache, and rash since removing ticks.  Rash is itchy in nature.  Denies purulent drainage from any of the lesions.  Rash is present scattered throughout body.   Denies any known fevers at home.    Past Medical History:  Diagnosis Date   Hypertension    no meds   Kidney stones     Patient Active Problem List   Diagnosis Date Noted   Head concussion 01/18/2012   HYPERTENSION 09/27/2007    Past Surgical History:  Procedure Laterality Date   HERNIA REPAIR     MANDIBULAR HARDWARE REMOVAL  04/14/2012   Procedure: MANDIBULAR HARDWARE REMOVAL;  Surgeon: Serena Colonel, MD;  Location:  [facility];  Service: ENT;  Laterality: N/A;   ORIF MANDIBULAR FRACTURE  02/26/2012   Procedure: OPEN REDUCTION INTERNAL FIXATION (ORIF) MANDIBULAR FRACTURE;  Surgeon: Serena Colonel, MD;  Location: [facility] OR;  Service: ENT;  Laterality: N/A;       Home Medications    Prior to Admission medications   Medication Sig Start Date End Date Taking? Authorizing Provider  doxycycline (VIBRAMYCIN) 100 MG capsule Take 1 capsule (100 mg total) by mouth 2 (two) times daily. 02/20/22  Yes Laird Runnion, Rolly Salter E, FNP  acetaminophen (TYLENOL) 325 MG tablet Take 650 mg by mouth every 6 (six) hours as needed. For pain    [provider]  ibuprofen (ADVIL,MOTRIN) 600 MG tablet Take 600 mg by mouth every 6 (six) hours as needed.  01/13/12   [provider]   morphine (ROXANOL) 20 MG/ML concentrated solution Take 0.5 mls (10 mg total) every 4 hours as needed for pain 04/09/12   Annamarie Dawley, MD  promethazine (PHENERGAN) 25 MG suppository Place 1 suppository (25 mg total) rectally every 6 (six) hours as needed for nausea. 02/26/12 03/04/12  Serena Colonel, MD  Tamsulosin HCl (FLOMAX) 0.4 MG CAPS Take 1 capsule (0.4 mg total) by mouth daily. 03/19/12   Gerhard Munch, MD  traMADol (ULTRAM) 50 MG tablet Take 50 mg by mouth every 6 (six) hours as needed.    [provider]    Family History Family History  Problem Relation Age of Onset   Hypertension Mother     Social History Social History   Tobacco Use   Smoking status: Never   Smokeless tobacco: Never  Substance Use Topics   Alcohol use: No   Drug use: No     Allergies   Shellfish allergy and Clarithromycin   Review of Systems Review of Systems Per HPI  Physical Exam Triage Vital Signs ED Triage Vitals [02/20/22 1047]  Enc Vitals Group     BP 132/88     Pulse Rate 86     Resp 18     Temp 98.9 F (37.2 C)     Temp Source Oral     SpO2 97 %  Weight      Height      Head Circumference      Peak Flow      Pain Score 0     Pain Loc      Pain Edu?      Excl. in GC?    No data found.  Updated Vital Signs BP 132/88 (BP Location: Left Arm)   Pulse 86   Temp 98.9 F (37.2 C) (Oral)   Resp 18   SpO2 97%   Visual Acuity Right Eye Distance:   Left Eye Distance:   Bilateral Distance:    Right Eye Near:   Left Eye Near:    Bilateral Near:     Physical Exam Constitutional:      General: He is not in acute distress.    Appearance: Normal appearance. He is not toxic-appearing or diaphoretic.  HENT:     Head: Normocephalic and atraumatic.  Eyes:     Extraocular Movements: Extraocular movements intact.     Conjunctiva/sclera: Conjunctivae normal.  Cardiovascular:     Rate and Rhythm: Normal rate and regular rhythm.     Pulses: Normal pulses.      Heart sounds: Normal heart sounds.  Pulmonary:     Effort: Pulmonary effort is normal. No respiratory distress.     Breath sounds: Normal breath sounds.  Skin:    Comments: Singular papular areas located to abdomen, posterior thigh, arms.  Tick bite areas with similar papular region to left upper chest and right posterior thigh near buttocks area.  No purulent drainage noted.  Neurological:     General: No focal deficit present.     Mental Status: He is alert and oriented to person, place, and time. Mental status is at baseline.  Psychiatric:        Mood and Affect: Mood normal.        Behavior: Behavior normal.        Thought Content: Thought content normal.        Judgment: Judgment normal.     UC Treatments / Results  Labs (all labs ordered are listed, but only abnormal results are displayed) Labs Reviewed  CBC  COMPREHENSIVE METABOLIC PANEL  CK  LYME DISEASE SEROLOGY W/REFLEX  ROCKY MTN SPOTTED FVR ABS PNL(IGG+IGM)    EKG   Radiology No results found.  Procedures Procedures (including critical care time)  Medications Ordered in UC Medications - No data to display  Initial Impression / Assessment and Plan / UC Course  I have reviewed the triage vital signs and the nursing notes.  Pertinent labs & imaging results that were available during my care of the patient were reviewed by me and considered in my medical decision making (see chart for details).     Ticks were completely removed so no tick removal needed at this time.  There is suspicion for Lyme disease versus [facility] spotted fever given patient's persistent symptoms.  Blood work is pending to test for Lyme disease, [facility] spotted fever, CK, CMP, CBC.  Do not think that emergent evaluation the hospital is necessary at this time.  Will treat with doxycycline for 10 days.  Patient was given strict return and ER precautions.  Patient verbalized understanding and was agreeable with plan. Final Clinical  Impressions(s) / UC Diagnoses   Final diagnoses:  Tick bite, unspecified site, initial encounter  Myalgia     Discharge Instructions      You have been prescribed doxycycline which will treat tick  bite infection.  We have performed several blood work tests on you and we will call if there are any abnormalities.  Please go to the hospital if symptoms persist or worsen.    ED Prescriptions     Medication Sig Dispense Auth. Provider   doxycycline (VIBRAMYCIN) 100 MG capsule Take 1 capsule (100 mg total) by mouth 2 (two) times daily. 20 capsule Gustavus BryantMound, Gurbani Figge E, [place]      PDMP not reviewed this encounter.   Gustavus BryantMound, Gregery Walberg E, [place]NP 02/20/22 1139

## 2022-02-22 LAB — COMPREHENSIVE METABOLIC PANEL
ALT: 23 IU/L (ref 0–44)
AST: 21 IU/L (ref 0–40)
Albumin/Globulin Ratio: 1.8 (ref 1.2–2.2)
Albumin: 4.6 g/dL (ref 3.8–4.9)
Alkaline Phosphatase: 101 IU/L (ref 44–121)
BUN/Creatinine Ratio: 9 (ref 9–20)
BUN: 10 mg/dL (ref 6–24)
Bilirubin Total: 0.7 mg/dL (ref 0.0–1.2)
CO2: 22 mmol/L (ref 20–29)
Calcium: 9.2 mg/dL (ref 8.7–10.2)
Chloride: 101 mmol/L (ref 96–106)
Creatinine, Ser: 1.08 mg/dL (ref 0.76–1.27)
Globulin, Total: 2.5 g/dL (ref 1.5–4.5)
Glucose: 72 mg/dL (ref 70–99)
Potassium: 4.1 mmol/L (ref 3.5–5.2)
Sodium: 141 mmol/L (ref 134–144)
Total Protein: 7.1 g/dL (ref 6.0–8.5)
eGFR: 82 mL/min/{1.73_m2} (ref >59)

## 2022-02-22 LAB — CBC
Hematocrit: 42.2 % (ref 37.5–51.0)
Hemoglobin: 13.6 g/dL (ref 13.0–17.7)
MCH: 26.7 pg (ref 26.6–33.0)
MCHC: 32.2 g/dL (ref 31.5–35.7)
MCV: 83 fL (ref 79–97)
Platelets: 284 10*3/uL (ref 150–450)
RBC: 5.1 x10E6/uL (ref 4.14–5.80)
RDW: 12.5 % (ref 11.6–15.4)
WBC: 11.9 10*3/uL — ABNORMAL HIGH (ref 3.4–10.8)

## 2022-02-22 LAB — ROCKY MTN SPOTTED FVR ABS PNL(IGG+IGM)
RMSF IgG: NEGATIVE
RMSF IgM: 0.71 index (ref 0.00–0.89)

## 2022-02-22 LAB — LYME DISEASE SEROLOGY W/REFLEX: Lyme Total Antibody EIA: NEGATIVE

## 2022-02-22 LAB — CK: Total CK: 347 U/L — ABNORMAL HIGH (ref 41–331)

## 2022-02-25 ENCOUNTER — Encounter (HOSPITAL_COMMUNITY): Payer: Self-pay

## 2022-02-25 ENCOUNTER — Emergency Department (HOSPITAL_COMMUNITY)
Admission: EM | Admit: 2022-02-25 | Discharge: 2022-02-25 | Disposition: A | Discharge status: Home / Self Care | Payer: 59 | Attending: Emergency Medicine | Admitting: Emergency Medicine

## 2022-02-25 ENCOUNTER — Emergency Department (HOSPITAL_COMMUNITY): Payer: 59

## 2022-02-25 ENCOUNTER — Other Ambulatory Visit: Payer: Self-pay

## 2022-02-25 DIAGNOSIS — R799 Abnormal finding of blood chemistry, unspecified: Secondary | ICD-10-CM | POA: Insufficient documentation

## 2022-02-25 DIAGNOSIS — M7752 Other enthesopathy of left foot: Secondary | ICD-10-CM | POA: Insufficient documentation

## 2022-02-25 DIAGNOSIS — D72829 Elevated white blood cell count, unspecified: Secondary | ICD-10-CM | POA: Insufficient documentation

## 2022-02-25 DIAGNOSIS — R21 Rash and other nonspecific skin eruption: Secondary | ICD-10-CM | POA: Diagnosis not present

## 2022-02-25 DIAGNOSIS — R899 Unspecified abnormal finding in specimens from other organs, systems and tissues: Secondary | ICD-10-CM

## 2022-02-25 DIAGNOSIS — M25572 Pain in left ankle and joints of left foot: Secondary | ICD-10-CM | POA: Diagnosis present

## 2022-02-25 DIAGNOSIS — R748 Abnormal levels of other serum enzymes: Secondary | ICD-10-CM | POA: Insufficient documentation

## 2022-02-25 DIAGNOSIS — M7750 Other enthesopathy of unspecified foot: Secondary | ICD-10-CM

## 2022-02-25 LAB — CBC WITH DIFFERENTIAL/PLATELET
Abs Immature Granulocytes: 0.03 10*3/uL (ref 0.00–0.07)
Basophils Absolute: 0 10*3/uL (ref 0.0–0.1)
Basophils Relative: 1 %
Eosinophils Absolute: 0.2 10*3/uL (ref 0.0–0.5)
Eosinophils Relative: 2 %
HCT: 40.8 % (ref 39.0–52.0)
Hemoglobin: 13.3 g/dL (ref 13.0–17.0)
Immature Granulocytes: 0 %
Lymphocytes Relative: 21 %
Lymphs Abs: 1.8 10*3/uL (ref 0.7–4.0)
MCH: 27.8 pg (ref 26.0–34.0)
MCHC: 32.6 g/dL (ref 30.0–36.0)
MCV: 85.4 fL (ref 80.0–100.0)
Monocytes Absolute: 0.7 10*3/uL (ref 0.1–1.0)
Monocytes Relative: 8 %
Neutro Abs: 5.9 10*3/uL (ref 1.7–7.7)
Neutrophils Relative %: 68 %
Platelets: 285 10*3/uL (ref 150–400)
RBC: 4.78 MIL/uL (ref 4.22–5.81)
RDW: 13.6 % (ref 11.5–15.5)
WBC: 8.5 10*3/uL (ref 4.0–10.5)
nRBC: 0 % (ref 0.0–0.2)

## 2022-02-25 LAB — COMPREHENSIVE METABOLIC PANEL
ALT: 26 U/L (ref 0–44)
AST: 22 U/L (ref 15–41)
Albumin: 4.1 g/dL (ref 3.5–5.0)
Alkaline Phosphatase: 81 U/L (ref 38–126)
Anion gap: 4 — ABNORMAL LOW (ref 5–15)
BUN: 11 mg/dL (ref 6–20)
CO2: 27 mmol/L (ref 22–32)
Calcium: 8.9 mg/dL (ref 8.9–10.3)
Chloride: 109 mmol/L (ref 98–111)
Creatinine, Ser: 1.04 mg/dL (ref 0.61–1.24)
GFR, Estimated: >60 mL/min (ref >60)
Glucose, Bld: 87 mg/dL (ref 70–99)
Potassium: 3.7 mmol/L (ref 3.5–5.1)
Sodium: 140 mmol/L (ref 135–145)
Total Bilirubin: 0.7 mg/dL (ref 0.3–1.2)
Total Protein: 7.3 g/dL (ref 6.5–8.1)

## 2022-02-25 LAB — CK: Total CK: 237 U/L (ref 49–397)

## 2022-02-25 MED ORDER — TRIAMCINOLONE ACETONIDE 0.1 % EX CREA: 1.0000 "application " | TOPICAL_CREAM | Freq: Two times a day (BID) | CUTANEOUS | 0 refills | Status: AC

## 2022-02-25 NOTE — ED Provider Triage Note (Signed)
Emergency Medicine Provider Triage Evaluation Note  LJ MIYAMOTO , a 56 y.o. male  was evaluated in triage.  Pt complains of elevated CK level on 02/20/22. The patient reports that he had two tick bites and went to urgent care to get labs. He was put on doxycycline for Lyme and RMSF prevention. He reports that the spots on his abdomen are still itching, but he has been taken his doxycycline. Reports he hurt his right ankle recently and isn't sure if that's what caused the increase in level after his [employer] research.   Review of Systems  Positive:  Negative:   Physical Exam  BP (!) 185/110   Pulse 74   Temp 97.9 F (36.6 C) (Oral)   Resp 18   Ht 5\' 7"  (1.702 m)   Wt 97.5 kg   SpO2 100%   BMI 33.67 kg/m  Gen:   Awake, no distress   Resp:  Normal effort  MSK:   Moves extremities without difficulty  Other:  Small papules on on abdomen and chest. No palmar or ankle rashes noticed.   Medical Decision Making  Medically screening exam initiated at 12:26 PM.  Appropriate orders placed.  MIKAEL SKODA was informed that the remainder of the evaluation will be completed by another provider, this initial triage assessment does not replace that evaluation, and the importance of remaining in the ED until their evaluation is complete.  Will redraw CK labs and basic labs. His CK level was barely elevated at 347. Negative for Lyme and RMSF on 02/20/22. CBC slightly elevated at 11.9 on this date as well. Normal CMP. Will get XR of left ankle.     02/22/22, Achille Rich 02/25/22 1232

## 2022-02-25 NOTE — ED Provider Notes (Signed)
[facility]  HOSPITAL-EMERGENCY DEPT Provider Note   CSN: [id number] Arrival date & time: 02/25/22  1212     History  Chief Complaint  Patient presents with   Abnormal Lab   Headache   achille tendon pain    Miguel Vaughn is a 56 y.o. male.  56 year old male presents with complaint of pain in his left ankle, states that he was visiting with family and playing basketball and has noticed pain along his left posterior heel since that time.  Also states that he went to urgent care recently because he had some headaches after taking 2 ticks off of his body, had labs drawn and was started on doxycycline.  Patient was called today and told that his CK was elevated and was advised to come to the emergency room.  He denies changes in bowel or bladder habits.  Patient is otherwise healthy.  Notes that he does have a few pruritic papules that have popped up over his extremities and chest after doing yard work.      Home Medications Prior to Admission medications   Medication Sig Start Date End Date Taking? Authorizing Provider  triamcinolone cream (KENALOG) 0.1 % Apply 1 application. topically 2 (two) times daily. 02/25/22  Yes Jeannie Fend, PA-C  acetaminophen (TYLENOL) 325 MG tablet Take 650 mg by mouth every 6 (six) hours as needed. For pain    [provider]  doxycycline (VIBRAMYCIN) 100 MG capsule Take 1 capsule (100 mg total) by mouth 2 (two) times daily. 02/20/22   Gustavus Bryant, FNP  ibuprofen (ADVIL,MOTRIN) 600 MG tablet Take 600 mg by mouth every 6 (six) hours as needed.  01/13/12   [provider]  morphine (ROXANOL) 20 MG/ML concentrated solution Take 0.5 mls (10 mg total) every 4 hours as needed for pain 04/09/12   Annamarie Dawley, MD  promethazine (PHENERGAN) 25 MG suppository Place 1 suppository (25 mg total) rectally every 6 (six) hours as needed for nausea. 02/26/12 03/04/12  Serena Colonel, MD  Tamsulosin HCl (FLOMAX) 0.4 MG CAPS Take 1 capsule  (0.4 mg total) by mouth daily. 03/19/12   Gerhard Munch, MD  traMADol (ULTRAM) 50 MG tablet Take 50 mg by mouth every 6 (six) hours as needed.    [provider]      Allergies    Shellfish allergy and Clarithromycin    Review of Systems   Review of Systems Negative except as per HPI Physical Exam Updated Vital Signs BP (!) 148/98   Pulse (!) 47   Temp 97.9 F (36.6 C) (Oral)   Resp 18   Ht 5\' 7"  (1.702 m)   Wt 97.5 kg   SpO2 98%   BMI 33.67 kg/m  Physical Exam Vitals and nursing note reviewed.  Constitutional:      General: He is not in acute distress.    Appearance: He is well-developed. He is not diaphoretic.  HENT:     Head: Normocephalic and atraumatic.  Cardiovascular:     Rate and Rhythm: Normal rate and regular rhythm.     Heart sounds: Normal heart sounds.  Pulmonary:     Effort: Pulmonary effort is normal.     Breath sounds: Normal breath sounds.  Musculoskeletal:     Comments: TTP left distal achilles without overlying erythema or swelling.   Skin:    General: Skin is warm and dry.     Comments: Papules, scattered, less than 12mm, located extremities and trunk, ~7-10 lesions total  Neurological:     Mental Status: He is alert and oriented to person, place, and time.  Psychiatric:        Behavior: Behavior normal.    ED Results / Procedures / Treatments   Labs (all labs ordered are listed, but only abnormal results are displayed) Labs Reviewed  COMPREHENSIVE METABOLIC PANEL - Abnormal; Notable for the following components:      Result Value   Anion gap 4 (*)    All other components within normal limits  CBC WITH DIFFERENTIAL/PLATELET  CK    EKG None  Radiology DG Ankle 2 Views Left  Result Date: 02/25/2022 CLINICAL DATA:  Basketball injury with heel pain. EXAM: LEFT ANKLE - 2 VIEW COMPARISON:  Fluid images same day FINDINGS: There is no evidence of fracture, dislocation, or joint effusion. There is no evidence of arthropathy or  other focal bone abnormality. Soft tissues are unremarkable. IMPRESSION: Negative radiographs. Electronically Signed   By: Paulina Fusi M.D.   On: 02/25/2022 13:06   DG Foot Complete Left  Result Date: 02/25/2022 CLINICAL DATA:  Pain at the heel. EXAM: LEFT FOOT - COMPLETE 3+ VIEW COMPARISON:  None Available. FINDINGS: There is no evidence of fracture or dislocation. Small plantar calcaneal spurring. Mild Achilles enthesopathy. Soft tissues are unremarkable. IMPRESSION: 1.  No evidence of fracture or dislocation. 2.  Mild Achilles enthesopathy. 3.  Plantar calcaneal spurring. Electronically Signed   By: Larose Hires D.O.   On: 02/25/2022 13:10    Procedures Procedures    Medications Ordered in ED Medications - No data to display  ED Course/ Medical Decision Making/ A&P                           Medical Decision Making Risk Prescription drug management.   56 year old male presents for evaluation for left ankle pain, papular rash, elevated CK at urgent care.  Labs repeated, compared to prior on file from his urgent care visit, had mild leukocytosis at that time now with normal WBC.  Chemistry is unremarkable. CK normal (previously elevated at [facility]).  XR left ankle with mild achilles enthesopathy, placed in CAM boot, recommend course of NSAID and follow-up with orthopedics if pain persist. Regarding the rash, will treat with triamcinolone, suspect contact rash after he was doing yard work in [place]. Recommend recheck with PCP.        Final Clinical Impression(s) / ED Diagnoses Final diagnoses:  Enthesopathy of ankle  Abnormal laboratory test result  Rash    Rx / DC Orders ED Discharge Orders          Ordered    triamcinolone cream (KENALOG) 0.1 %  2 times daily        02/25/22 1357              Jeannie Fend, PA-C 02/25/22 1438    Cathren Laine, MD 02/25/22 1644

## 2022-02-25 NOTE — Discharge Instructions (Addendum)
Follow-up with orthopedics, call to schedule appointment.  Apply triamcinolone cream to your rash.  You can take Aleve twice daily for your ankle pain.  Check with your primary care provider.  Your repeat lab work is reassuring, specifically, your CK is normal.

## 2022-02-25 NOTE — ED Triage Notes (Signed)
Patient c/o headache, abnormal lab-CK-347, and left achilles pain x 3 days.

## 2024-03-15 IMAGING — DX DG FOOT COMPLETE 3+V*L*
3 series · 3 of 3 positions shown · non-contrast
Comparison: None Available.

CLINICAL DATA: Pain at the heel.

EXAM:
LEFT FOOT - COMPLETE 3+ VIEW

[foot ap]
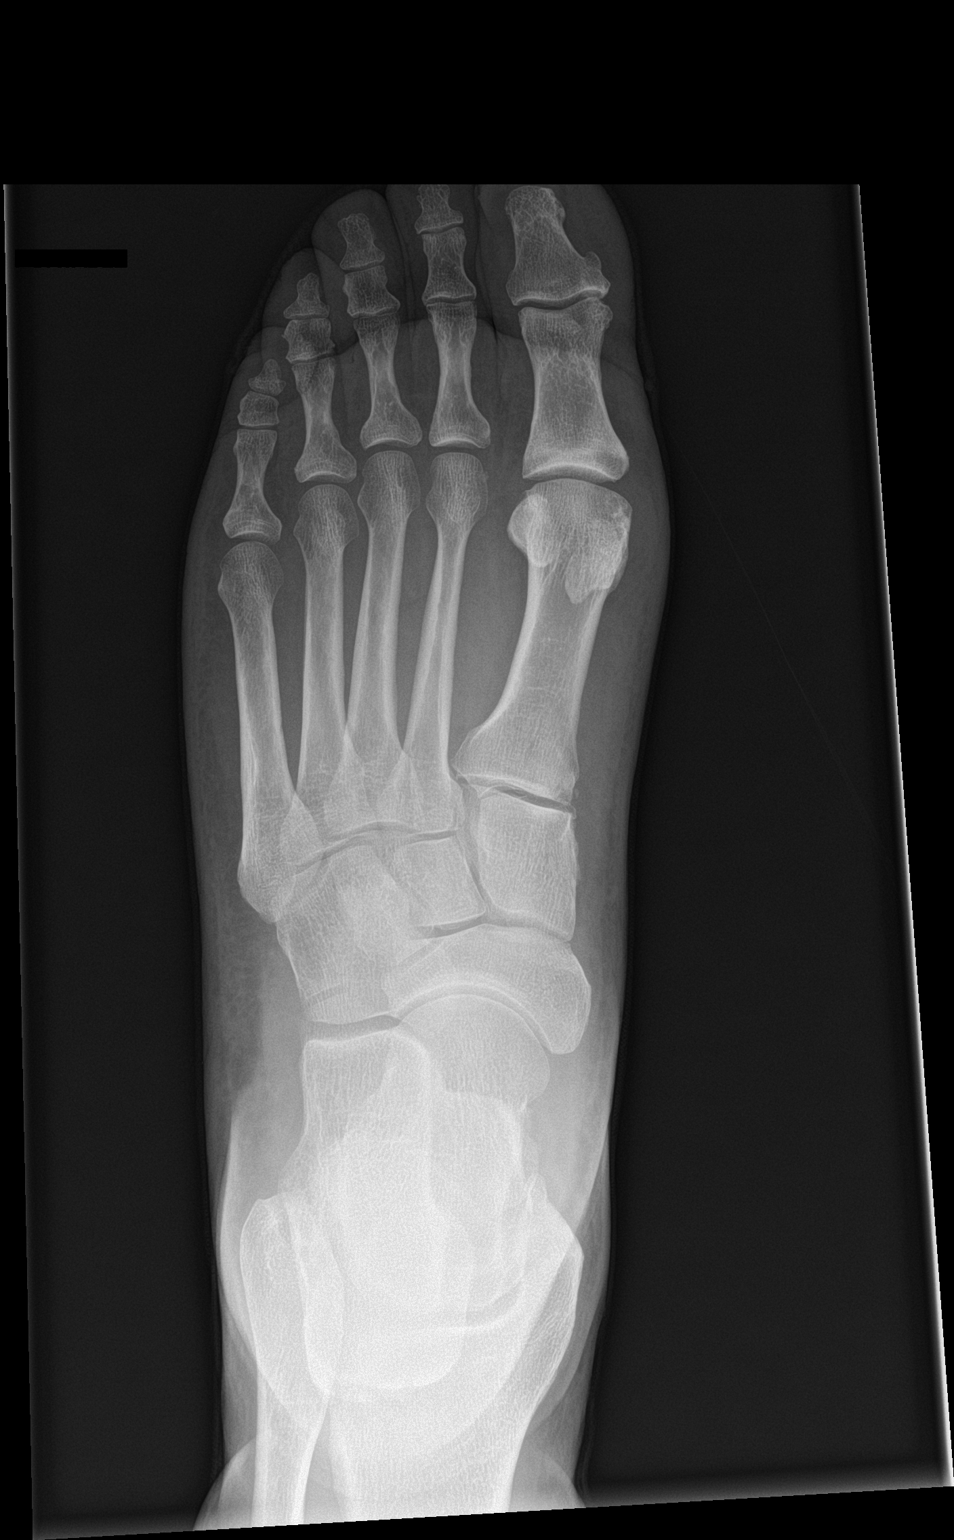

[foot obl]
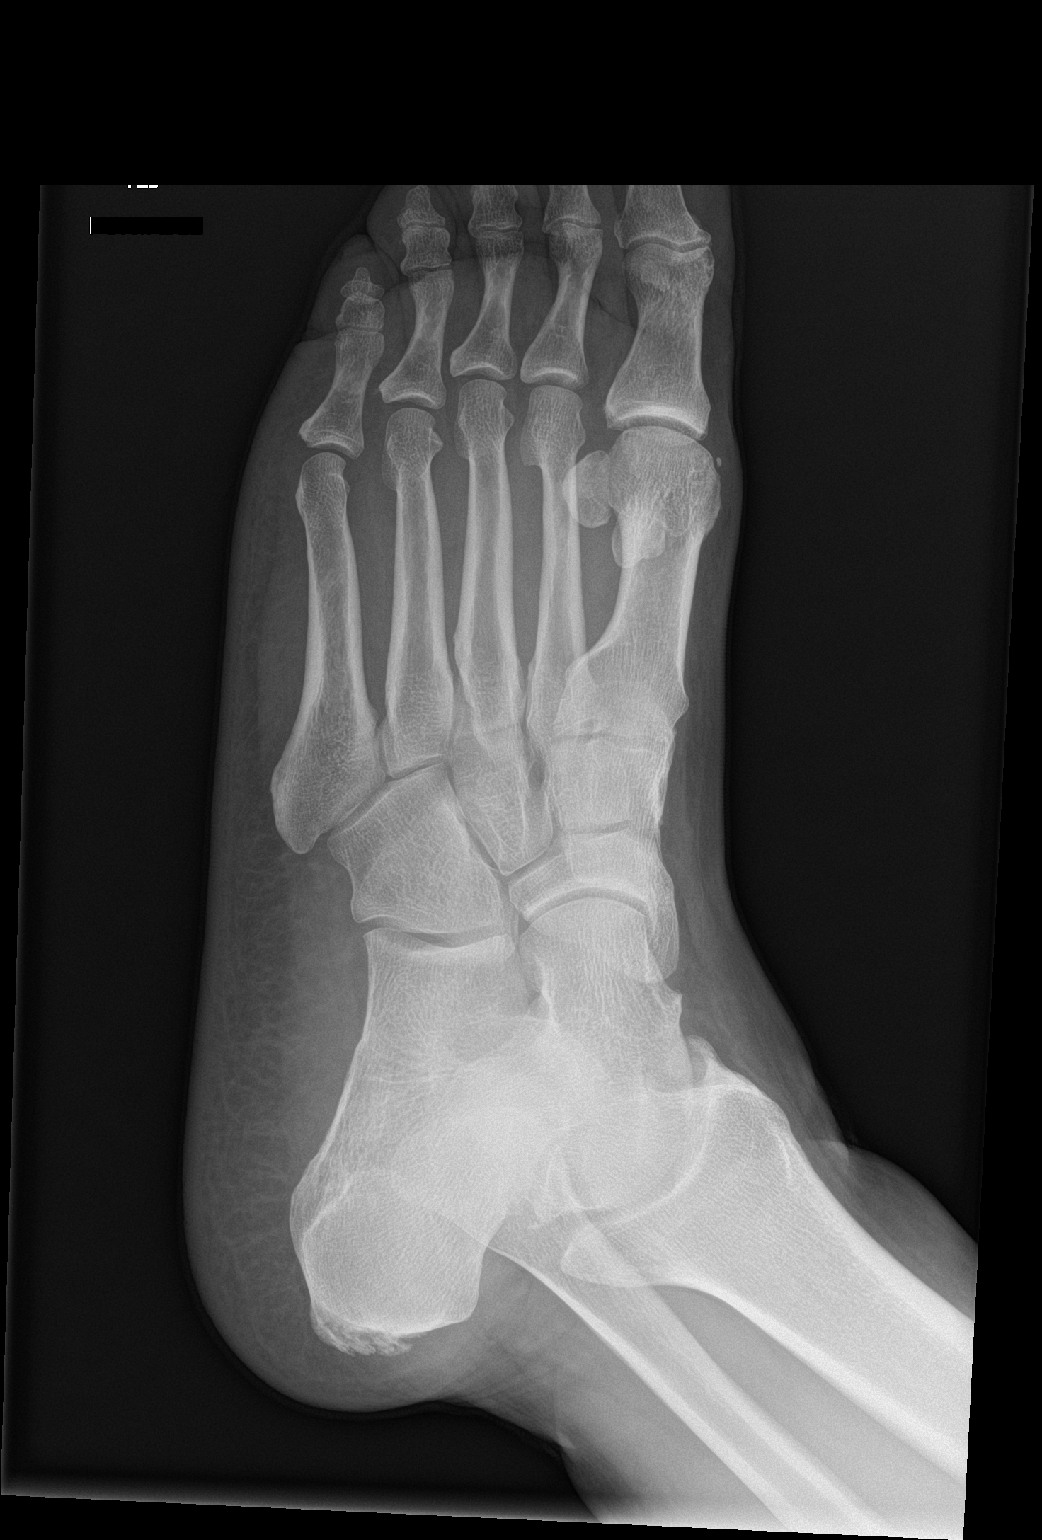

[foot lat]
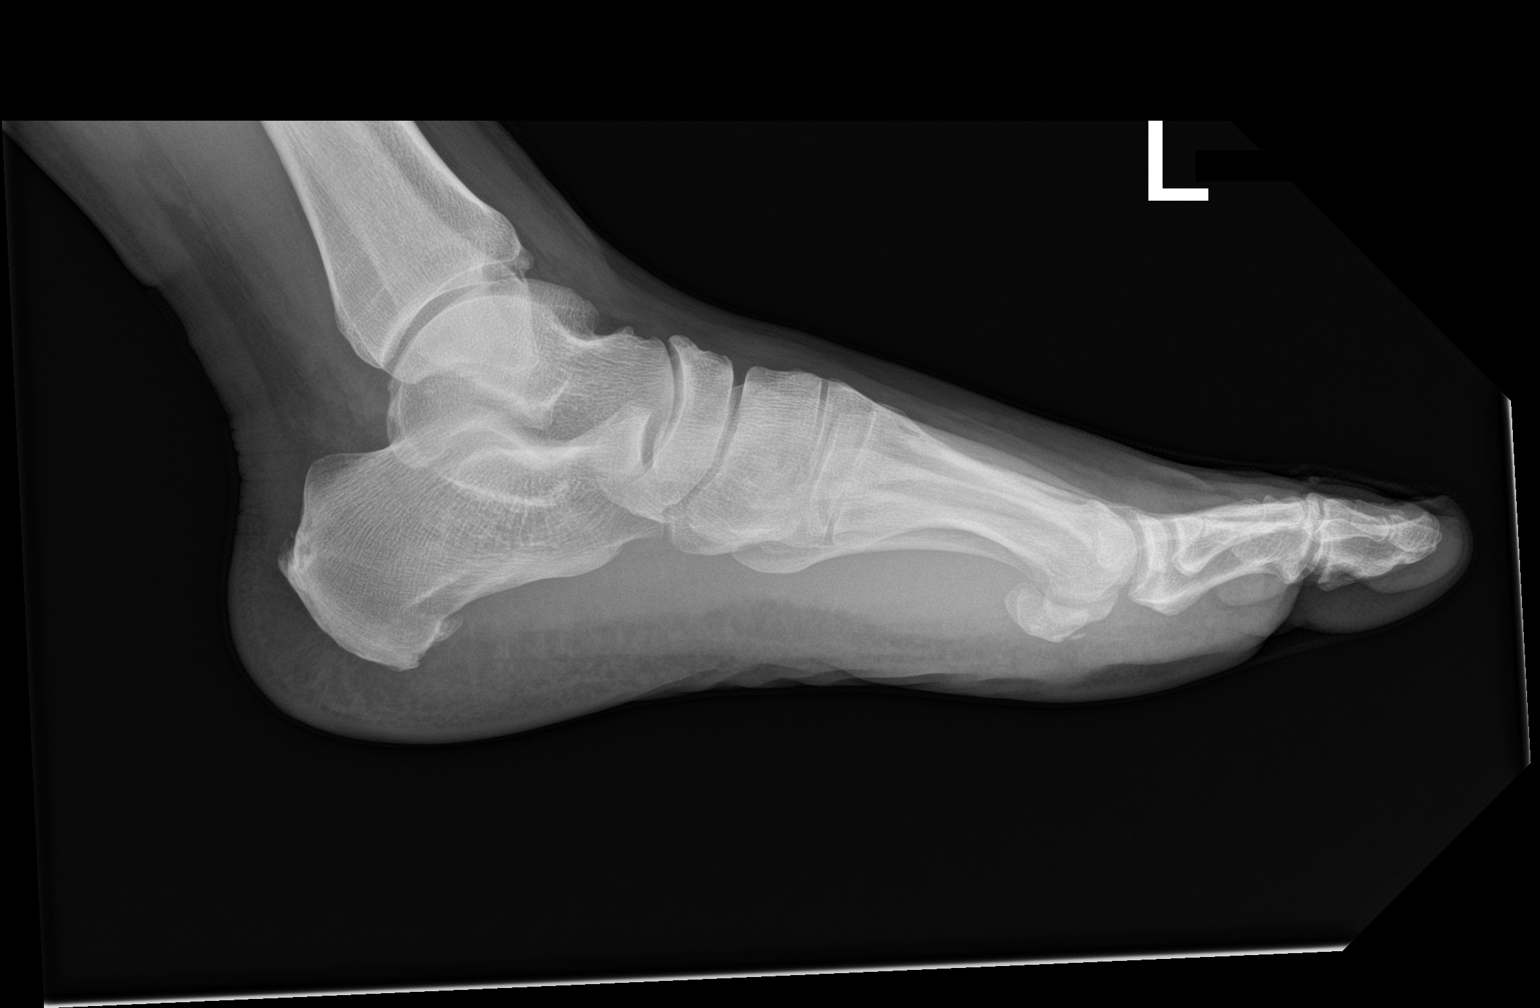

[3 of 3 positions shown; findings below may reference images not displayed]

FINDINGS: There is no evidence of fracture or dislocation. Small plantar
calcaneal spurring. Mild Achilles enthesopathy. Soft tissues are
unremarkable.
IMPRESSION: 1.  No evidence of fracture or dislocation.

2.  Mild Achilles enthesopathy.

3.  Plantar calcaneal spurring.
